# Patient Record
Sex: Female | Born: 1940 | Race: White | Hispanic: No | Smoking: Never smoker
Health system: Southern US, Community
[De-identification: ages and names within clinical notes are randomized; demographics above are authoritative.]

---

## 2009-01-06 ENCOUNTER — Ambulatory Visit: Payer: Self-pay | Admitting: Family Medicine

## 2009-01-06 DIAGNOSIS — N760 Acute vaginitis: Secondary | ICD-10-CM | POA: Insufficient documentation

## 2009-01-06 DIAGNOSIS — L290 Pruritus ani: Secondary | ICD-10-CM | POA: Insufficient documentation

## 2009-01-06 DIAGNOSIS — G4700 Insomnia, unspecified: Secondary | ICD-10-CM | POA: Insufficient documentation

## 2009-01-06 DIAGNOSIS — E785 Hyperlipidemia, unspecified: Secondary | ICD-10-CM | POA: Insufficient documentation

## 2009-01-06 DIAGNOSIS — K644 Residual hemorrhoidal skin tags: Secondary | ICD-10-CM | POA: Insufficient documentation

## 2009-01-06 DIAGNOSIS — F329 Major depressive disorder, single episode, unspecified: Secondary | ICD-10-CM | POA: Insufficient documentation

## 2009-01-06 DIAGNOSIS — M810 Age-related osteoporosis without current pathological fracture: Secondary | ICD-10-CM | POA: Insufficient documentation

## 2009-02-01 ENCOUNTER — Telehealth: Payer: Self-pay | Admitting: Family Medicine

## 2009-04-13 ENCOUNTER — Telehealth: Payer: Self-pay | Admitting: Family Medicine

## 2009-08-03 ENCOUNTER — Encounter: Admission: RE | Admit: 2009-08-03 | Discharge: 2009-08-03 | Payer: Self-pay | Admitting: Family Medicine

## 2009-11-11 ENCOUNTER — Encounter: Payer: Self-pay | Admitting: Family Medicine

## 2009-11-17 ENCOUNTER — Encounter: Payer: Self-pay | Admitting: Family Medicine

## 2010-07-21 ENCOUNTER — Telehealth: Payer: Self-pay | Admitting: Family Medicine

## 2010-07-21 DIAGNOSIS — I8390 Asymptomatic varicose veins of unspecified lower extremity: Secondary | ICD-10-CM | POA: Insufficient documentation

## 2010-07-22 ENCOUNTER — Encounter (INDEPENDENT_AMBULATORY_CARE_PROVIDER_SITE_OTHER): Payer: Self-pay | Admitting: *Deleted

## 2010-11-21 ENCOUNTER — Ambulatory Visit: Payer: Self-pay | Admitting: Family Medicine

## 2010-11-21 ENCOUNTER — Encounter: Admission: RE | Admit: 2010-11-21 | Discharge: 2010-11-21 | Payer: Self-pay | Admitting: Family Medicine

## 2010-11-21 DIAGNOSIS — M545 Low back pain, unspecified: Secondary | ICD-10-CM | POA: Insufficient documentation

## 2010-11-21 DIAGNOSIS — H6980 Other specified disorders of Eustachian tube, unspecified ear: Secondary | ICD-10-CM | POA: Insufficient documentation

## 2010-11-22 ENCOUNTER — Telehealth: Payer: Self-pay | Admitting: Family Medicine

## 2010-11-22 DIAGNOSIS — H9319 Tinnitus, unspecified ear: Secondary | ICD-10-CM | POA: Insufficient documentation

## 2010-12-01 ENCOUNTER — Encounter: Payer: Self-pay | Admitting: Family Medicine

## 2011-01-13 ENCOUNTER — Encounter: Payer: Self-pay | Admitting: Family Medicine

## 2011-01-24 NOTE — Assessment & Plan Note (Signed)
Summary: L ear pressure   Vital Signs:  Patient profile:   70 year old female Height:      62.5 inches Weight:      154 pounds BMI:     27.82 O2 Sat:      97 % on Room air Temp:     98.0 degrees F oral Pulse rate:   68 / minute BP sitting:   119 / 67  (left arm) Cuff size:   regular  Vitals Entered By: Payton Spark CMA (November 21, 2010 10:55 AM)  O2 Flow:  Room air CC: L ear pressure x 1 week.   Primary Care [facility]:  Seymour Bars DO  CC:  L ear pressure x 1 week.Marland Kitchen  History of Present Illness: 70 yo WF presents for L ear pressure x 1 wk.  No previous problems like this.  Has loss of hearing on the L with some loss on the R.  No tinnitus or dizziness.  No head congestion or allergy problems.  Taking Relafen for back pain, worse after raking the leaves.  Denies radiation, weakness or tinlging in her legs.  Has not had imaging done in years.  Denies change to bowel or bladder.    Current Medications (verified): 1)  Lipitor 10 Mg Tabs (Atorvastatin Calcium) .... Take One By Mouth Daily 2)  Zoloft 50 Mg Tabs (Sertraline Hcl) .... Take One By Mouth Daily 3)  Ambien 5 Mg Tabs (Zolpidem Tartrate) .... Take One By Mouth At [facility] As Needed Sleep 4)  Anamantle Hc 3-0.5 % Crea (Lidocaine-Hydrocortisone Ace) .... Apply To Hemorrhoids Bid  Allergies (verified): No Known Drug Allergies  Past History:  Past Medical History: Reviewed history from 01/06/2009 and no changes required. palpitations cardiac stress test normal 2006 high cholesterol HRT age [age] osteoporosis  Social History: Reviewed history from 01/06/2009 and no changes required. Retired [employer] Services operational officer. Widowed after 44 yrs in 2006. 1 son in [place]. sister in law here.  Lives alone.  No pets. Has a house here and 1 in [place], [place]. Never smoked.  Denies ETOH. 4 caffeine/ day.  Walks 3 days/ wk.  Review of Systems      See HPI  Physical Exam  General:  alert, well-developed, well-nourished, and well-hydrated.     Head:  normocephalic and atraumatic.   Eyes:  PERRLA; conjunctiva clear Ears:  EACs patent; TMs translucent and gray with good cone of light and bony landmarks. with small clear effusion on the L Nose:  no nasal discharge.   Mouth:  pharynx pink and moist.   Neck:  supple, full ROM, and no masses.   Lungs:  Normal respiratory effort, chest expands symmetrically. Lungs are clear to auscultation, no crackles or wheezes. Heart:  Normal rate and regular rhythm. S1 and S2 normal without gallop, murmur, click, rub or other extra sounds. Neurologic:  gait normal and DTRs symmetrical and normal.   Skin:  color normal.     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    Problem # 2:  BACK PAIN, LUMBAR, CHRONIC (ICD-724.2) Will xray today, start PT if indicated and alternate her relafen with tylenol arthritis.   Her updated medication list for this problem includes:    Nabumetone 750 Mg Tabs (Nabumetone) .Marland Kitchen... 1 tab by mouth two times a day with food as needed for back pain  Orders: T-DG Lumbar  Spine 2-3 Views (72100)  Complete Medication List: 1)  Lipitor 10 Mg Tabs (Atorvastatin calcium) .... Take one by mouth daily 2)  Zoloft 50 Mg Tabs (Sertraline hcl) .... Take one by mouth daily 3)  Ambien 5 Mg Tabs (Zolpidem tartrate) .... Take one by mouth at hs as needed sleep 4)  Anamantle Hc 3-0.5 % Crea (Lidocaine-hydrocortisone ace) .... Apply to hemorrhoids bid 5)  Nabumetone 750 Mg Tabs (Nabumetone) .Marland Kitchen.. 1 tab by mouth two times a day with food as needed for back pain  Patient Instructions: 1)  Alternate Tylenol Arthritis with RElafen for back pain. 2)  Xray back today.  Will call you w/ results tonight. 3)  Plan to start PT down the hall. 4)  For L ear effusion (clear fluid), start OTC plain Allegra once daily. 5)  If hearing problem does not  improve after 10 days, call me and I can get you in with ENT. Prescriptions: NABUMETONE 750 MG TABS (NABUMETONE) 1 tab by mouth two times a day with food as needed for back pain  #60 x 2   Entered and Authorized by:   Seymour Bars DO   Signed by:   Seymour Bars DO on 11/21/2010   Method used:   Electronically to        [facility] [phone]* (retail)       [address]       [place], [place]  [address]       Ph: [phone]       Fax: [phone]   RxID:   [id number]    Orders Added: 1)  T-DG Lumbar Spine 2-3 Views [72100] 2)  Est. Patient Level III [03474]

## 2011-01-24 NOTE — Progress Notes (Signed)
Summary: Buzzing in ear  Phone Note Call from Patient Call back at Home Phone [phone]   Caller: Patient Call For: Seymour Bars DO Summary of Call: left ear pain is killing her. Allegra not helping her at all- buzzing sound still present. ? get a referral to ENT Initial call taken by: Kathlene November LPN,  November 22, 2010 4:01 PM  Follow-up for Phone Call        Will send in RX for Tylenol #3 to use for pain.  REferral made to ENT. Follow-up by: Seymour Bars DO,  November 22, 2010 4:43 PM  New Problems: TINNITUS (ICD-388.30)   New Problems: TINNITUS (ICD-388.30) New/Updated Medications: TYLENOL WITH CODEINE #3 300-30 MG TABS (ACETAMINOPHEN-CODEINE) 1-2 tabs by mouth three times a day as needed pain; take with food Prescriptions: TYLENOL WITH CODEINE #3 300-30 MG TABS (ACETAMINOPHEN-CODEINE) 1-2 tabs by mouth three times a day as needed pain; take with food  #24 x 0   Entered and Authorized by:   Seymour Bars DO   Signed by:   Seymour Bars DO on 11/22/2010   Method used:   Printed then faxed to ...       [facility] [phone]* (retail)       [address] [place], [place]  [address]       Ph: [phone]       Fax: [phone]   RxID:   [id number]   Appended Document: Buzzing in ear Pt aware of above.

## 2011-01-24 NOTE — Progress Notes (Signed)
Summary: Referral  Phone Note Call from Patient   Caller: Patient Summary of Call: Call Back   [phone]  Patient need a Referral to [facility]... Initial call taken by: Vanessa Swaziland,  July 21, 2010 10:55 AM  Follow-up for Phone Call        is this for varicose veins or spider veins? Follow-up by: Seymour Bars DO,  July 21, 2010 11:11 AM  Additional Follow-up for Phone Call Additional follow up Details #1::        [facility] for Pt to CB to clarify.  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 She has had surgery on both legs in the past but now veins are back and worse than before. Pt would like to be referred to [facility] Additional Follow-up by: Payton Spark CMA,  July 21, 2010 12:54 PM  New Problems: VARICOSE VEINS, LOWER EXTREMITIES (ICD-454.9)   Additional Follow-up for Phone Call Additional follow up Details #2::    referral put in.  Pls print from [place] office. Follow-up by: Seymour Bars DO,  July 21, 2010 1:46 PM  New Problems: VARICOSE VEINS, LOWER EXTREMITIES (ICD-454.9)

## 2011-01-24 NOTE — Letter (Signed)
Summary: Primary Care Consult Scheduled Letter  [facility]  [address]   [place], [place] [address]   Phone: [phone]  Fax: [phone]      07/22/2010 MRN: [id number]  Molly Pruitt [address] [place], [place]  [address]    Dear Ms. Laurine Blazer,     We have scheduled an appointment for you.  At the recommendation of Dr.Bowen, we have scheduled you a consult with [facility] on 11/24/10 at 11:30.  Their address is [address], [place] [employer]. [phone]. The office phone number is [phone].  If this appointment day and time is not convenient for you, please feel free to call the office of the doctor you are being referred to at the number listed above and reschedule the appointment.     It is important for you to keep your scheduled appointments. We are here to make sure you are given good patient care.    Thank you, Michaelle Copas, [phone] Patient Care Coordinator [facility]

## 2011-01-26 NOTE — Consult Note (Signed)
Summary: [facility]  [facility]   Imported By: Lanelle Bal 12/09/2010 11:03:18  _____________________________________________________________________  External Attachment:    Type:   Image     Comment:   External Document

## 2011-02-15 NOTE — Therapy (Signed)
Summary: [facility]   Imported By: Kassie Mends 02/09/2011 08:15:44  _____________________________________________________________________  External Attachment:    Type:   Image     Comment:   External Document

## 2011-11-30 ENCOUNTER — Ambulatory Visit (INDEPENDENT_AMBULATORY_CARE_PROVIDER_SITE_OTHER): Payer: Medicare Other | Admitting: Family Medicine

## 2011-11-30 ENCOUNTER — Encounter: Payer: Self-pay | Admitting: Family Medicine

## 2011-11-30 VITALS — BP 119/64 | HR 67 | Temp 98.1°F | Ht 62.5 in | Wt 142.0 lb

## 2011-11-30 DIAGNOSIS — I252 Old myocardial infarction: Secondary | ICD-10-CM

## 2011-11-30 DIAGNOSIS — H6502 Acute serous otitis media, left ear: Secondary | ICD-10-CM

## 2011-11-30 DIAGNOSIS — H6500 Acute serous otitis media, unspecified ear: Secondary | ICD-10-CM

## 2011-11-30 DIAGNOSIS — J40 Bronchitis, not specified as acute or chronic: Secondary | ICD-10-CM

## 2011-11-30 MED ORDER — BENZONATATE 200 MG PO CAPS: 200.0000 mg | ORAL_CAPSULE | Freq: Three times a day (TID) | ORAL | Status: AC | PRN

## 2011-11-30 MED ORDER — AZITHROMYCIN 250 MG PO TABS: ORAL_TABLET | ORAL | Status: AC

## 2011-11-30 NOTE — Progress Notes (Signed)
  Subjective:    Patient ID: Molly Pruitt, female    DOB: 1941/01/17, 70 y.o.   MRN: [id number]  Cough This is a new problem. The current episode started 1 to 4 weeks ago. The problem has been gradually worsening. The problem occurs every few minutes. The cough is productive of purulent sputum. Associated symptoms include a sore throat. Pertinent negatives include no chest pain, nasal congestion, postnasal drip, rash, rhinorrhea, shortness of breath, sweats or wheezing. Associated symptoms comments: Lear pain. The symptoms are aggravated by lying down. She has tried nothing for the symptoms. There is no history of asthma, bronchitis, COPD, emphysema or pneumonia.   Patient informs me that she had a MI in August and is currently under a Cardiologist care   Review of Systems  HENT: Positive for sore throat. Negative for rhinorrhea and postnasal drip.   Respiratory: Positive for cough. Negative for shortness of breath and wheezing.   Cardiovascular: Negative for chest pain.       MI this summer in August she states that she has 2 blockages that they are treating medically.  Skin: Negative for rash.  All other systems reviewed and are negative.      BP 119/64  Pulse 67  Temp(Src) 98.1 F (36.7 C) (Oral)  Ht 5' 2.5" (1.588 m)  Wt 142 lb (64.411 kg)  BMI 25.56 kg/m2  SpO2 100% Objective:   Physical Exam  Nursing note and vitals reviewed. Constitutional: She is oriented to person, place, and time. She appears well-developed and well-nourished. No distress.  HENT:  Head: Normocephalic and atraumatic.  Right Ear: Tympanic membrane and external ear normal.  Left Ear: Ear canal normal.  No middle ear effusion.  Nose: Nose normal.  Mouth/Throat: Oropharynx is clear and moist. No oropharyngeal exudate.  Eyes: Right eye exhibits no discharge. Left eye exhibits no discharge. No scleral icterus.  Neck: Neck supple.  Cardiovascular: Normal rate, regular rhythm and normal heart sounds.     Pulmonary/Chest: Effort normal. No respiratory distress. She has no wheezes. She has rhonchi. She has no rales.  Lymphadenopathy:    She has cervical adenopathy.  Neurological: She is alert and oriented to person, place, and time.  Skin: Skin is warm and dry.  Psychiatric: She has a normal mood and affect. Her behavior is normal.          Assessment & Plan:  Bronchitis we'll place on Z-Pak Tessalon Perles she has nasal congestion but going to hold off on placing her on any type of antihistamine decongestant because of her recent MI return in a week if not better return to office  For continuation of care once her cardiologist semi releases her

## 2011-11-30 NOTE — Patient Instructions (Signed)
Bronchiectasis Bronchiectasis is the destruction and widening of the large airways caused by mucous blockage. It is one of the chronic obstructive pulmonary diseases (COPD). It is often complicated by bronchitis and emphysema. You can be born with it (congenital bronchiectasis). It also may develop later in life. When the lungs cannot get rid of mucus, it gathers in the airways. The blockage leads to infection. This causes a soreness (inflammation) in the air passageways. The inflammation causes the air passageways to weaken and widen. The weakened passages can then become scarred and deformed. This begins a cycle which may continue to worsen. CAUSES   Bronchiectasis often begins in childhood and in the [place] about half of these cases come from cystic fibrosis.   Recurrent lung infections are another common cause.   Abnormal lung defenses a person is born.   Foreign bodies or other blockage in the lungs are other causes.  Breathing in food particles regularly while eating is an example.  It is also caused from a birth defect in the lining of the lungs.  SYMPTOMS  Symptoms vary from patient to patient but can include:  Coughing which is worse lying down.   Shortness of breath and wheezing.   Weakness, weight loss, and fatigue.   With infections the mucus may be discolored, contain blood and smell badly.  DIAGNOSIS  Tests for diagnosing bronchiectasis may include:  Chest X-rays or CT scans.   Breathing tests which tell your caregiver how your lungs are working.   Sputum cultures to check for infection.   Blood testing and tests for other related diseases or causes such as cystic fibrosis or tuberculosis may be done.  HOME CARE INSTRUCTIONS   Cough suppressants may be used if you cannot rest; however cough is a protective mechanism for clearing our lungs and is one reason for avoiding cough suppressants if able as they take away this protection.   Your caregiver may prescribe an  expectorant to loosen the mucus to be coughed up.   Only take over-the-counter or prescription medicines for pain, discomfort, or fever as directed by your caregiver.   A cold steam vaporizer or humidifier in your room or home may help loosen secretions.   Cough is most often worse at night. Sleeping in a semi-upright position in a recliner or using a couple pillows will help.   Obtain rest as needed.   Make arrangements for a follow-up visit.   Avoid cigarette smoke and lung irritants. Smoking is a common cause of bronchitis and can contribute to pneumonia. Stopping this habit is an important self help.   Stay inside when pollution and ozone levels are high.   Stay current with vaccinations and immunizations.   Avoid sedatives and antihistamines which tend to thicken the mucus in the lungs.   Always try to stay well hydrated by drinking plenty of water.   Antibiotics are often given for infection. Take them as directed.   Medications called bronchodilators are often used to make the air passages larger.   Physical therapy methods should be learned so you can know how to clear mucus more easily from your lungs.  PROGNOSIS   Bronchiectasis varies in severity from patient to patient.   When lung infections are treated immediately, bronchiectasis is less likely to develop.   When it does develop, following the above instructions will help with the outcome.   Lung transplants or removing a portion of a lung are sometimes done for severe cases. Death is uncommon but  can be caused by massive bleeding into the lungs.  SEEK IMMEDIATE MEDICAL CARE IF:   You develop more pus like (purulent) sputum, have uncontrolled temperature, or become progressively more ill (debilitated). This is especially true if you are elderly or sick from another disease.   You cannot control your cough with suppressants and are losing sleep.   You begin coughing up blood.   You develop chest pain or  increasing shortness of breath.   You develop pain which is getting worse or is uncontrolled with medications.   You develop an oral temperature above 102 F (38.9 C) or as instructed, after not having a temperature for one or more days (afebrile).   If any of the symptoms which brought you initially into the emergency room are getting worse rather than better.  MAKE SURE YOU:   Understand these instructions.   Will watch your condition.   Will get help right away if you are not doing well or get worse.  Document Released: 10/08/2007 Document Revised: 08/23/2011 Document Reviewed: 10/08/2007 [facility] Patient Information 2012 [place], [place].

## 2011-12-03 ENCOUNTER — Encounter: Payer: Self-pay | Admitting: Family Medicine

## 2011-12-03 DIAGNOSIS — I252 Old myocardial infarction: Secondary | ICD-10-CM | POA: Insufficient documentation

## 2012-01-30 ENCOUNTER — Ambulatory Visit: Payer: BC Managed Care – PPO

## 2012-02-06 ENCOUNTER — Ambulatory Visit (INDEPENDENT_AMBULATORY_CARE_PROVIDER_SITE_OTHER): Payer: Medicare Other | Admitting: Family Medicine

## 2012-02-06 VITALS — BP 125/64 | HR 60

## 2012-02-06 DIAGNOSIS — R300 Dysuria: Secondary | ICD-10-CM

## 2012-02-06 LAB — POCT URINALYSIS DIPSTICK
Bilirubin, UA: NEGATIVE
Glucose, UA: NEGATIVE
Ketones, UA: NEGATIVE
Leukocytes, UA: NEGATIVE
Nitrite, UA: NEGATIVE
pH, UA: 5.5

## 2012-02-06 NOTE — Progress Notes (Signed)
In fact I put the order in.

## 2012-02-06 NOTE — Progress Notes (Signed)
Please go ahead and get that urine culture please.

## 2012-02-06 NOTE — Progress Notes (Signed)
  Subjective:    Patient ID: Molly Pruitt, female    DOB: 1941/01/15, 71 y.o.   MRN: [id number] UTI; frequency, burning and discomfort x 2 weeks: do you want specimen sent for culture. HPI    Review of Systems     Objective:   Physical Exam        Assessment & Plan:  Patient not seen at this visit by the physician.

## 2012-02-13 LAB — URINE CULTURE: Colony Count: 25000

## 2012-02-15 MED ORDER — NITROFURANTOIN MONOHYD MACRO 100 MG PO CAPS: 100.0000 mg | ORAL_CAPSULE | Freq: Two times a day (BID) | ORAL | Status: AC

## 2012-02-15 NOTE — Progress Notes (Signed)
Addended by: Hassan Rowan on: 02/15/2012 05:03 PM   Modules accepted: Orders

## 2013-01-22 ENCOUNTER — Ambulatory Visit: Payer: BC Managed Care – PPO | Admitting: Physician Assistant

## 2014-03-04 ENCOUNTER — Other Ambulatory Visit: Payer: Self-pay | Admitting: *Deleted

## 2014-03-04 DIAGNOSIS — M5137 Other intervertebral disc degeneration, lumbosacral region: Secondary | ICD-10-CM

## 2014-03-04 DIAGNOSIS — Z139 Encounter for screening, unspecified: Secondary | ICD-10-CM

## 2014-03-04 DIAGNOSIS — Z13820 Encounter for screening for osteoporosis: Secondary | ICD-10-CM

## 2014-03-13 ENCOUNTER — Other Ambulatory Visit: Payer: Self-pay | Admitting: Family Medicine

## 2014-03-13 DIAGNOSIS — M5137 Other intervertebral disc degeneration, lumbosacral region: Secondary | ICD-10-CM

## 2014-03-17 ENCOUNTER — Ambulatory Visit (INDEPENDENT_AMBULATORY_CARE_PROVIDER_SITE_OTHER): Payer: Medicare Other

## 2014-03-17 DIAGNOSIS — M129 Arthropathy, unspecified: Secondary | ICD-10-CM

## 2014-03-17 DIAGNOSIS — M5137 Other intervertebral disc degeneration, lumbosacral region: Secondary | ICD-10-CM

## 2015-01-06 ENCOUNTER — Encounter: Payer: Self-pay | Admitting: Physician Assistant

## 2015-01-06 ENCOUNTER — Ambulatory Visit (INDEPENDENT_AMBULATORY_CARE_PROVIDER_SITE_OTHER): Payer: Medicare Other | Admitting: Physician Assistant

## 2015-01-06 VITALS — BP 117/62 | HR 63 | Wt 146.0 lb

## 2015-01-06 DIAGNOSIS — R21 Rash and other nonspecific skin eruption: Secondary | ICD-10-CM | POA: Diagnosis not present

## 2015-01-06 MED ORDER — CLOTRIMAZOLE-BETAMETHASONE 1-0.05 % EX CREA: 1.0000 "application " | TOPICAL_CREAM | Freq: Two times a day (BID) | CUTANEOUS | Status: DC

## 2015-01-06 NOTE — Patient Instructions (Signed)
Biopsy Care After Refer to this sheet in the next few weeks. These instructions provide you with information on caring for yourself after your procedure. Your caregiver may also give you more specific instructions. Your treatment has been planned according to current medical practices, but problems sometimes occur. Call your caregiver if you have any problems or questions after your procedure. If you had a fine needle biopsy, you may have soreness at the biopsy site for 1 to 2 days. If you had an open biopsy, you may have soreness at the biopsy site for 3 to 4 days. HOME CARE INSTRUCTIONS   You may resume normal diet and activities as directed.  Change bandages (dressings) as directed. If your wound was closed with a skin glue (adhesive), it will wear off and begin to peel in 7 days.  Only take over-the-counter or prescription medicines for pain, discomfort, or fever as directed by your caregiver.  Ask your caregiver when you can bathe and get your wound wet. SEEK IMMEDIATE MEDICAL CARE IF:   You have increased bleeding (more than a small spot) from the biopsy site.  You notice redness, swelling, or increasing pain at the biopsy site.  You have pus coming from the biopsy site.  You have a fever.  You notice a bad smell coming from the biopsy site or dressing.  You have a rash, have difficulty breathing, or have any allergic problems. MAKE SURE YOU:   Understand these instructions.  Will watch your condition.  Will get help right away if you are not doing well or get worse. Document Released: 06/30/2005 Document Revised: 03/04/2012 Document Reviewed: 06/08/2011 Exit[employer] Patient Information 2015 [employer]. This information is not intended to replace advice given to you by your health care provider. Make sure you discuss any questions you have with your health care provider.  

## 2015-01-06 NOTE — Progress Notes (Signed)
Subjective:    Patient ID: Molly Pruitt, female    DOB: 1941/10/02, 74 y.o.   MRN: [id number]  HPI  Patient is a 74 year old female who presents to the clinic to establish care. She admits she is not living here full-time. She travels back and forth to [place] where she has another primary care physician. She has a problem she would like to be addressed here while she is in town.  .. Active Ambulatory Problems    Diagnosis Date Noted  . HYPERLIPIDEMIA 01/06/2009  . DEPRESSION 01/06/2009  . 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 HEMORRHOIDS, EXTERNAL 01/06/2009  . UNSPECIFIED VAGINITIS AND VULVOVAGINITIS 01/06/2009  . PRURITUS ANI 01/06/2009  . BACK PAIN, LUMBAR, CHRONIC 11/21/2010  . OSTEOPOROSIS 01/06/2009  . INSOMNIA 01/06/2009  . Myocardial infarct, old 12/03/2011   Resolved Ambulatory Problems    Diagnosis Date Noted  . No Resolved Ambulatory Problems   No Additional Past Medical History   .Marland KitchenNo family history on file.   .. History   Social History  . Marital Status: Widowed    Spouse Name: N/A    Number of Children: N/A  . Years of Education: N/A   Occupational History  . Not on file.   Social History Main Topics  . Smoking status: Never Smoker   . Smokeless tobacco: Never Used  . Alcohol Use: Not on file  . Drug Use: Not on file  . Sexual Activity: Not on file   Other Topics Concern  . Not on file   Social History Narrative   Patient comes in today to discuss ongoing off and on rash that occurs on her lower extremities only. She noticed the rash about one year ago. This appeared to be after she was raking some leaves. The lesions come and go and are very itchy. She denies any pain. She denies any fever, chills, sore throat or any other respiratory symptoms. As they are drying up they leave a crusty center and that eventually will clear as well. She has used over-the-counter  anti-itch cream which seems to help some. She's not sure if this correlates but yesterday she took a Relafen and the bumps seemed to intensify. She's never been evaluated for this problem.   Review of Systems  All other systems reviewed and are negative.      Objective:   Physical Exam  Constitutional: She is oriented to person, place, and time. She appears well-developed and well-nourished.  HENT:  Head: Normocephalic and atraumatic.  Cardiovascular: Normal rate, regular rhythm and normal heart sounds.   Pulmonary/Chest: Effort normal and breath sounds normal.  Neurological: She is alert and oriented to person, place, and time.  Skin:     Psychiatric: She has a normal mood and affect. Her behavior is normal.          Assessment & Plan:  Important to note we do not have an up-to-date medication list. We submitted a request to her PCP for this. We just are not received it. Once we get this we will added into the system to be up-to-date.  Rash-unclear etiology at this point. Punch biopsy was performed today. Will call patient with results. In the meantime given Lotrisone cream to use to see if it helps with the active patches today. It does not appear to be fungal but this would also help with any fungal elements.  Punch Biopsy Procedure Note  Pre-operative Diagnosis: unknown  rash  Post-operative Diagnosis: same  Locations:left upper thigh  Indications: unknown itchy rash  Anesthesia: Lidocaine 1% without epinephrine without added sodium bicarbonate  Procedure Details  History of allergy to iodine: no Patient informed of the risks (including bleeding and infection) and benefits of the  procedure and Verbal informed consent obtained.  The lesion and surrounding area was given a sterile prep using betadyne and draped in the usual sterile fashion. The skin was then stretched perpendicular to the skin tension lines and the lesion removed using the 6 mm punch. The resulting  ellipse was then closed. The wound was closed with 4-0 prolene using simple interrupted stitches. Antibiotic ointment and a sterile dressing applied. The specimen was sent for pathologic examination. The patient tolerated the procedure well.  EBL: scant   Condition: Stable  Complications: none.  Plan: 1. Instructed to keep the wound dry and covered for 24-48h and clean thereafter. 2. Warning signs of infection were reviewed.   3. Recommended that the patient use OTC acetaminophen as needed for pain.  4. Return for suture removal in 7 days.

## 2015-01-13 ENCOUNTER — Other Ambulatory Visit: Payer: Self-pay | Admitting: Physician Assistant

## 2015-01-13 DIAGNOSIS — R21 Rash and other nonspecific skin eruption: Secondary | ICD-10-CM

## 2015-01-18 ENCOUNTER — Telehealth: Payer: Self-pay | Admitting: Physician Assistant

## 2015-01-18 ENCOUNTER — Other Ambulatory Visit: Payer: Self-pay | Admitting: *Deleted

## 2015-01-18 MED ORDER — NABUMETONE 500 MG PO TABS: 500.0000 mg | ORAL_TABLET | Freq: Every day | ORAL | Status: DC

## 2015-01-18 NOTE — Telephone Encounter (Signed)
Call pt: finally got med list. Seems relafen could be the most likely drug to be reacting to if infact a drug reaction due to as needed status. All other medications are regular meds.  Hopefully you can get autoimmune work up and we can give you some more information. I would stay away from relafen at this time.

## 2015-01-21 ENCOUNTER — Encounter: Payer: Self-pay | Admitting: Physician Assistant

## 2015-01-21 DIAGNOSIS — R21 Rash and other nonspecific skin eruption: Secondary | ICD-10-CM | POA: Insufficient documentation

## 2015-01-21 LAB — COMPLETE METABOLIC PANEL WITH GFR
ALT: 10 U/L (ref 0–35)
AST: 18 U/L (ref 0–37)
Albumin: 3.9 g/dL (ref 3.5–5.2)
Alkaline Phosphatase: 57 U/L (ref 39–117)
BILIRUBIN TOTAL: 1.2 mg/dL (ref 0.2–1.2)
BUN: 12 mg/dL (ref 6–23)
CHLORIDE: 108 meq/L (ref 96–112)
CO2: 26 meq/L (ref 19–32)
Calcium: 9.1 mg/dL (ref 8.4–10.5)
Creat: 0.59 mg/dL (ref 0.50–1.10)
Glucose, Bld: 81 mg/dL (ref 70–99)
Potassium: 5 mEq/L (ref 3.5–5.3)
Sodium: 143 mEq/L (ref 135–145)
TOTAL PROTEIN: 5.9 g/dL — AB (ref 6.0–8.3)

## 2015-01-21 LAB — CBC WITH DIFFERENTIAL/PLATELET
BASOS PCT: 1 % (ref 0–1)
Basophils Absolute: 0.1 10*3/uL (ref 0.0–0.1)
EOS PCT: 6 % — AB (ref 0–5)
Eosinophils Absolute: 0.3 10*3/uL (ref 0.0–0.7)
HEMATOCRIT: 38.7 % (ref 36.0–46.0)
Hemoglobin: 12.8 g/dL (ref 12.0–15.0)
LYMPHS PCT: 23 % (ref 12–46)
Lymphs Abs: 1.2 10*3/uL (ref 0.7–4.0)
MCH: 30 pg (ref 26.0–34.0)
MCHC: 33.1 g/dL (ref 30.0–36.0)
MCV: 90.8 fL (ref 78.0–100.0)
MPV: 9.8 fL (ref 8.6–12.4)
Monocytes Absolute: 0.4 10*3/uL (ref 0.1–1.0)
Monocytes Relative: 7 % (ref 3–12)
Neutro Abs: 3.4 10*3/uL (ref 1.7–7.7)
Neutrophils Relative %: 63 % (ref 43–77)
Platelets: 254 10*3/uL (ref 150–400)
RBC: 4.26 MIL/uL (ref 3.87–5.11)
RDW: 13.7 % (ref 11.5–15.5)
WBC: 5.4 10*3/uL (ref 4.0–10.5)

## 2015-01-21 LAB — C-REACTIVE PROTEIN: CRP: <0.5 mg/dL (ref <0.60)

## 2015-01-21 LAB — SEDIMENTATION RATE: SED RATE: 5 mm/h (ref 0–22)

## 2015-01-21 LAB — CYCLIC CITRUL PEPTIDE ANTIBODY, IGG: Cyclic Citrullin Peptide Ab: <2 U/mL (ref 0.0–5.0)

## 2015-01-21 LAB — ANA: Anti Nuclear Antibody(ANA): NEGATIVE

## 2015-01-21 LAB — RHEUMATOID FACTOR

## 2015-02-23 NOTE — Telephone Encounter (Signed)
Per amber pt was called but was not documented.

## 2016-01-22 IMAGING — MR MR LUMBAR SPINE W/O CM
4 of 5 series · 27 of 48 positions shown · non-contrast
Comparison: None.

CLINICAL DATA: Low back, no leg pain

EXAM:
MRI LUMBAR SPINE WITHOUT CONTRAST
TECHNIQUE: Multiplanar, multisequence MR imaging was performed. No intravenous
contrast was administered.

[Series 3: T1 · sagittal · 4.0mm · 0.51mm/px · 6 of 14 slices shown (1 of 2)]
[im 1/14]
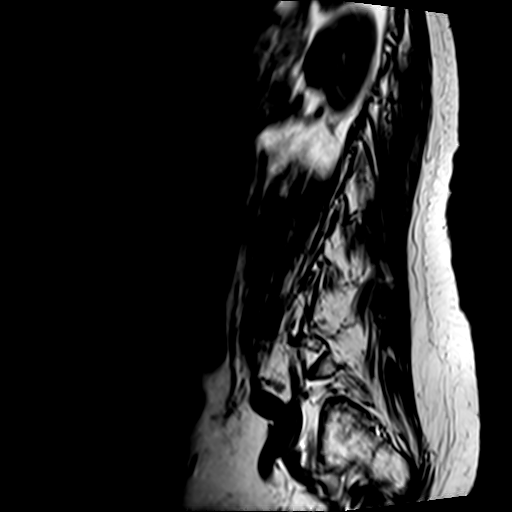
[im 3/14]
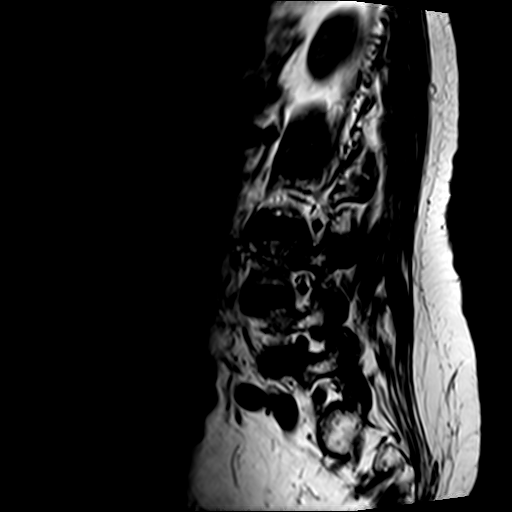
[im 6/14]
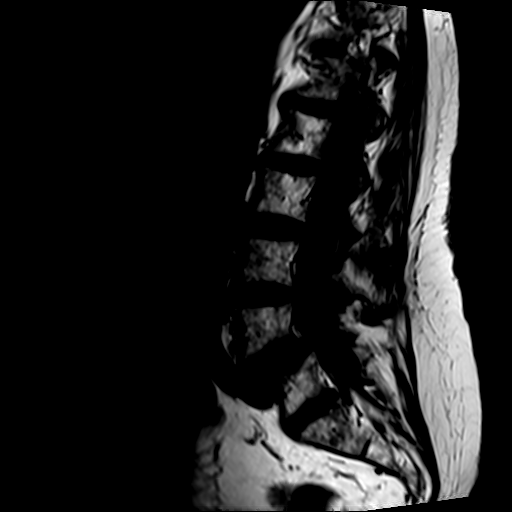
[im 8/14]
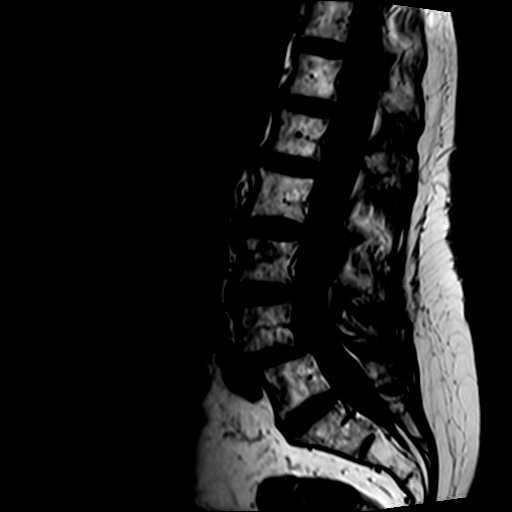
[im 11/14]
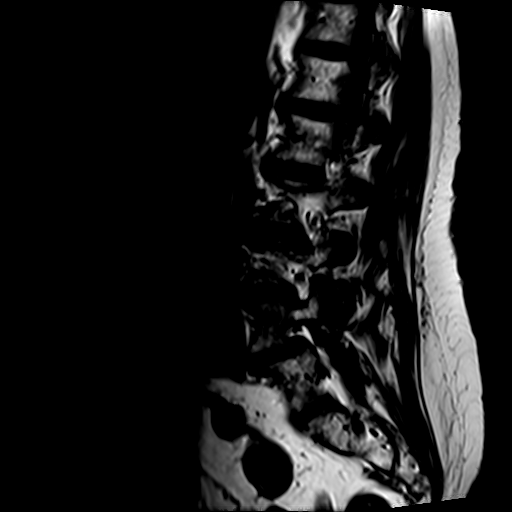
[im 14/14]
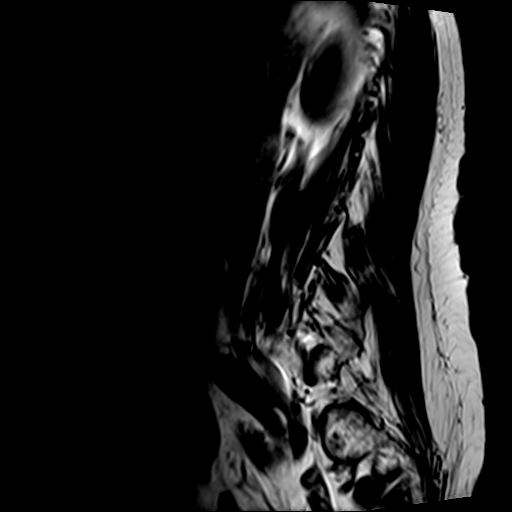

[Series 4: T2 · sagittal · 4.0mm · 0.81mm/px · 7 of 14 slices shown (1 of 2)]
[im 1/14]
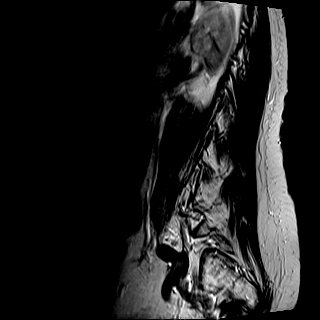
[im 3/14]
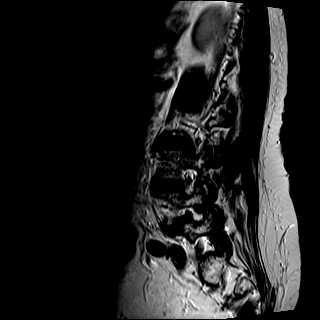
[im 5/14]
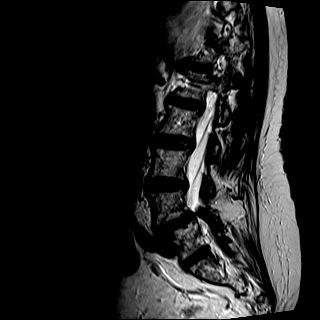
[im 7/14]
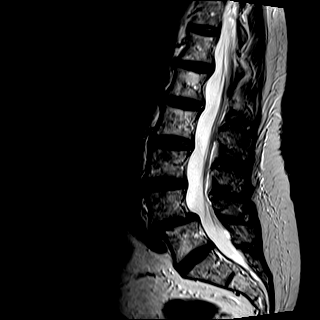
[im 9/14]
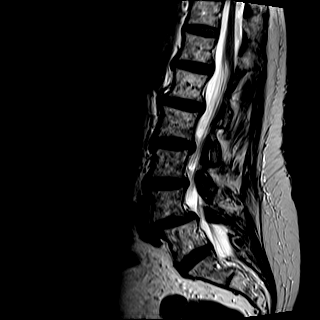
[im 11/14]
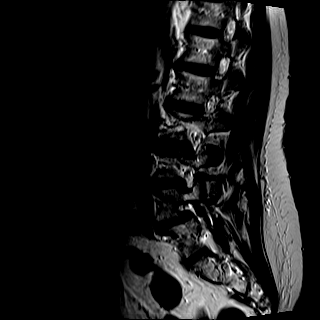
[im 14/14]
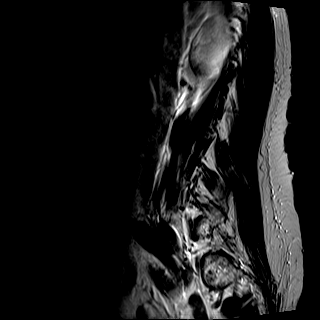

[Series 6: T2 · axial · 4.0mm · 0.39mm/px · z∈[+30,+202]mm · 8 of 30 slices shown (2 of 2)]
[im 1/30]
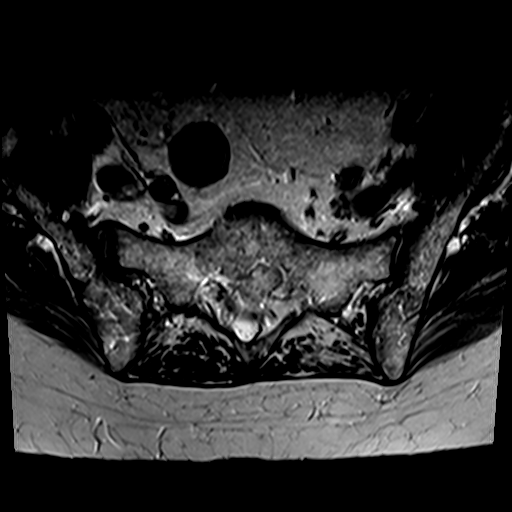
[im 5/30]
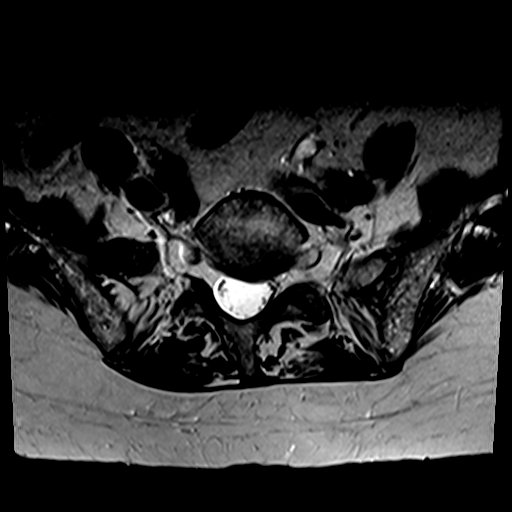
[im 9/30]
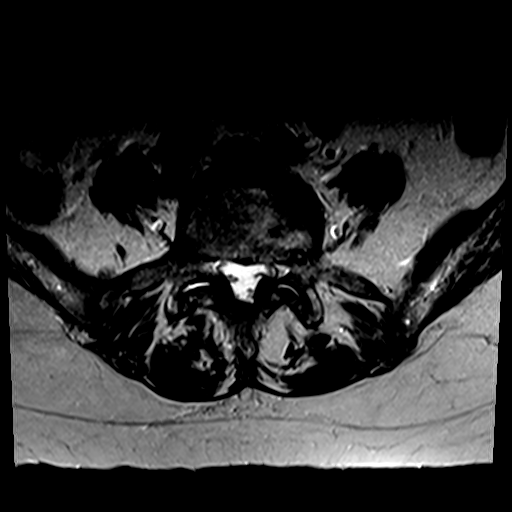
[im 14/30]
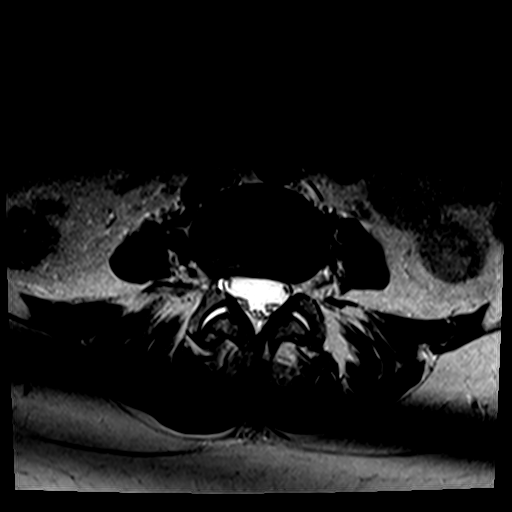
[im 16/30]
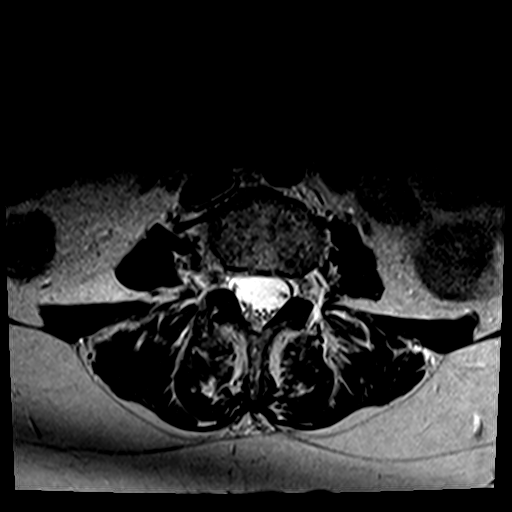
[im 21/30]
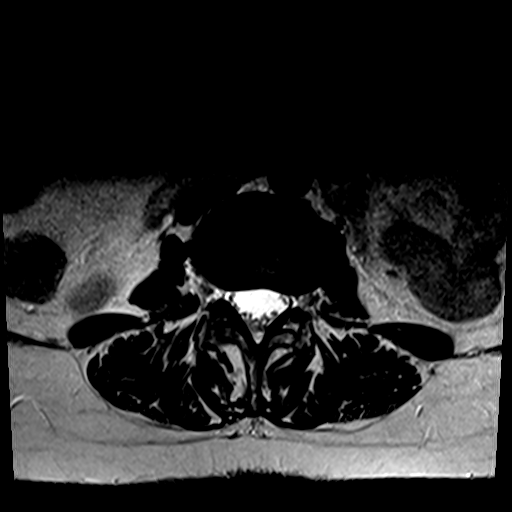
[im 25/30]
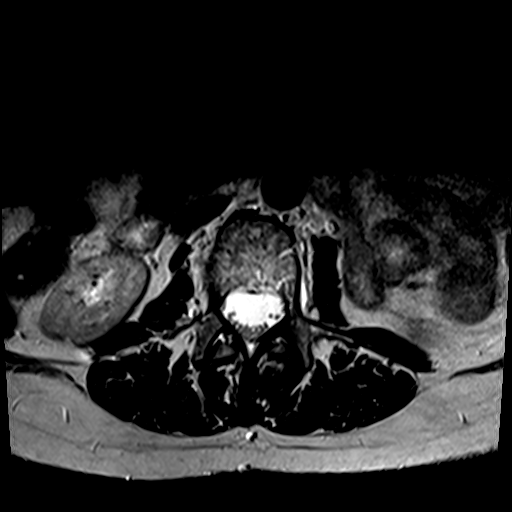
[im 30/30]
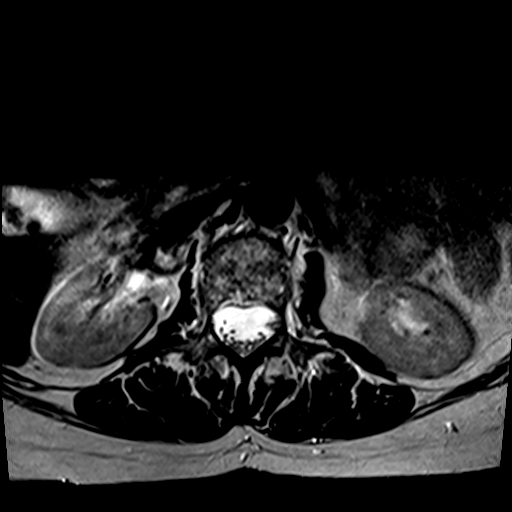

[Series 7: T1 · axial · 4.0mm · 0.78mm/px · z∈[+30,+177]mm · 6 of 30 slices shown (2 of 2)]
[im 1/30]
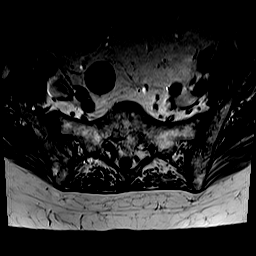
[im 5/30]
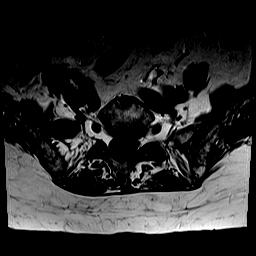
[im 9/30]
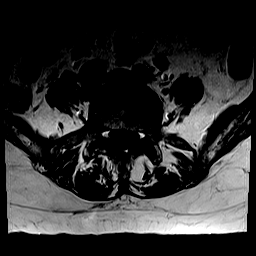
[im 14/30]
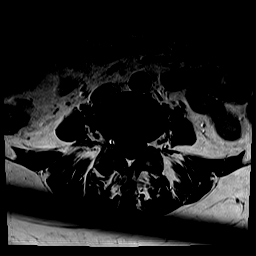
[im 16/30]
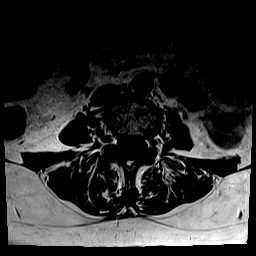
[im 25/30]
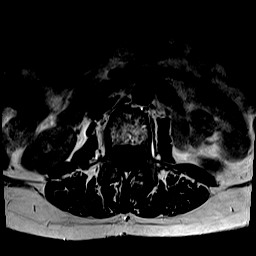

[27 of 48 positions shown; findings below may reference images not displayed]

FINDINGS: The vertebral bodies of the lumbar spine are normal in size. There
is grade 1 anterolisthesis of L4 on L5 secondary to bilateral facet
arthropathy. There is normal bone marrow signal demonstrated
throughout the vertebra. There is degenerative disc disease at L4-5.

The spinal cord is normal in signal and contour. The cord terminates
normally at T12-L1 . The nerve roots of the cauda equina and the
filum terminale are normal.

The visualized portions of the SI joints are unremarkable.

The imaged intra-abdominal contents are unremarkable.

T12-L1: No significant disc bulge. No evidence of neural foraminal
stenosis. No central canal stenosis.

L1-L2: No significant disc bulge. No evidence of neural foraminal
stenosis. No central canal stenosis.

L2-L3: Mild broad-based disc bulge. Mild bilateral facet
arthropathy. No evidence of neural foraminal stenosis. No central
canal stenosis.

L3-L4: Mild broad-based disc bulge. Mild bilateral facet
arthropathy. No evidence of neural foraminal stenosis. No central
canal stenosis.

L4-L5: Pseudo disc bulge secondary to grade 1 anterolisthesis.
Severe bilateral facet arthropathy with ligamentum flavum infolding
resulting in bilateral lateral recess stenosis. Mild bilateral
foraminal stenosis. Mild central canal stenosis.

L5-S1: No significant disc bulge. No evidence of neural foraminal
stenosis. No central canal stenosis.
IMPRESSION: 1. At L4-5 there is grade 1 anterolisthesis with severe bilateral
facet arthropathy and ligamentum flavum infolding resulting in
bilateral lateral recess stenosis and mild bilateral foraminal
stenosis. There is mild central canal stenosis.

## 2016-09-21 ENCOUNTER — Ambulatory Visit: Payer: Medicare Other | Admitting: Family Medicine

## 2016-09-27 ENCOUNTER — Encounter: Payer: Self-pay | Admitting: Physician Assistant

## 2016-09-27 DIAGNOSIS — I340 Nonrheumatic mitral (valve) insufficiency: Secondary | ICD-10-CM | POA: Insufficient documentation

## 2016-09-27 DIAGNOSIS — I351 Nonrheumatic aortic (valve) insufficiency: Secondary | ICD-10-CM | POA: Insufficient documentation

## 2016-09-27 DIAGNOSIS — I071 Rheumatic tricuspid insufficiency: Secondary | ICD-10-CM | POA: Insufficient documentation

## 2017-02-09 ENCOUNTER — Encounter: Payer: Self-pay | Admitting: Physician Assistant

## 2017-02-09 ENCOUNTER — Ambulatory Visit (INDEPENDENT_AMBULATORY_CARE_PROVIDER_SITE_OTHER): Payer: Medicare Other | Admitting: Physician Assistant

## 2017-02-09 VITALS — BP 103/62 | HR 73 | Temp 98.2°F | Ht 62.5 in | Wt 147.0 lb

## 2017-02-09 DIAGNOSIS — J208 Acute bronchitis due to other specified organisms: Secondary | ICD-10-CM | POA: Diagnosis not present

## 2017-02-09 MED ORDER — BENZONATATE 100 MG PO CAPS: ORAL_CAPSULE | ORAL | 0 refills | Status: DC

## 2017-02-09 MED ORDER — IPRATROPIUM BROMIDE 0.06 % NA SOLN: 2.0000 | Freq: Three times a day (TID) | NASAL | 1 refills | Status: DC

## 2017-02-09 MED ORDER — AZITHROMYCIN 250 MG PO TABS: ORAL_TABLET | ORAL | 0 refills | Status: DC

## 2017-02-09 NOTE — Progress Notes (Signed)
   Subjective:    Patient ID: Molly Pruitt, female    DOB: 1941/04/04, 76 y.o.   MRN: [id number]  HPI  Pt is a 76 yo female who presents to the clinic with 4 days of coughing that is not productive except for first thing in the morning. She had some night sweats that she feels like had a fever. No SOB or wheezing. She is taking mucinex with little relief. She denies any ear pain, sinus pressure, ST. She feels like the mucus is stuck in her lungs.    Review of Systems  All other systems reviewed and are negative.      Objective:   Physical Exam  Constitutional: She is oriented to person, place, and time. She appears well-developed and well-nourished.  HENT:  Head: Normocephalic and atraumatic.  Right Ear: External ear normal.  Left Ear: External ear normal.  Nose: Nose normal.  Mouth/Throat: Oropharynx is clear and moist. No oropharyngeal exudate.  TM's clear.  Negative sinus pressure to palpation.   Neck: Normal range of motion. Neck supple.  Cardiovascular: Normal rate, regular rhythm and normal heart sounds.   Pulmonary/Chest: Effort normal and breath sounds normal. She has no wheezes. She has no rales. She exhibits no tenderness.  Lymphadenopathy:    She has no cervical adenopathy.  Neurological: She is alert and oriented to person, place, and time.  Psychiatric: She has a normal mood and affect. Her behavior is normal.          Assessment & Plan:  Marland KitchenMarland KitchenLovena was seen today for cough, headache, nausea and diarrhea.  Diagnoses and all orders for this visit:  Acute bronchitis due to other specified organisms -     benzonatate (TESSALON) 100 MG capsule; Take 1-2 tablets as needed up to three times a day. -     ipratropium (ATROVENT) 0.06 % nasal spray; Place 2 sprays into both nostrils 3 (three) times daily. -     azithromycin (ZITHROMAX) 250 MG tablet; Take 2 tablets now and then one tablet for 4 days.   zpak was printed out for patient to hold. Discussed with patient I  do not think she needs at this time. She requested it since she was given it at last time she had bronchitis in 2012. Discussed other symptom care with tessalon and atrovent. Follow up as needed.

## 2017-02-09 NOTE — Patient Instructions (Addendum)
Upper Respiratory Infection, Adult Most upper respiratory infections (URIs) are caused by a virus. A URI affects the nose, throat, and upper air passages. The most common type of URI is often called "the common cold." Follow these instructions at home:  Take medicines only as told by your doctor.  Gargle warm saltwater or take cough drops to comfort your throat as told by your doctor.  Use a warm mist humidifier or inhale steam from a shower to increase air moisture. This may make it easier to breathe.  Drink enough fluid to keep your pee (urine) clear or pale yellow.  Eat soups and other clear broths.  Have a healthy diet.  Rest as needed.  Go back to work when your fever is gone or your doctor says it is okay.  You may need to stay home longer to avoid giving your URI to others.  You can also wear a face mask and wash your hands often to prevent spread of the virus.  Use your inhaler more if you have asthma.  Do not use any tobacco products, including cigarettes, chewing tobacco, or electronic cigarettes. If you need help quitting, ask your doctor. Contact a doctor if:  You are getting worse, not better.  Your symptoms are not helped by medicine.  You have chills.  You are getting more short of breath.  You have brown or red mucus.  You have yellow or brown discharge from your nose.  You have pain in your face, especially when you bend forward.  You have a fever.  You have puffy (swollen) neck glands.  You have pain while swallowing.  You have white areas in the back of your throat. Get help right away if:  You have very bad or constant:  Headache.  Ear pain.  Pain in your forehead, behind your eyes, and over your cheekbones (sinus pain).  Chest pain.  You have long-lasting (chronic) lung disease and any of the following:  Wheezing.  Long-lasting cough.  Coughing up blood.  A change in your usual mucus.  You have a stiff neck.  You have  changes in your:  Vision.  Hearing.  Thinking.  Mood. This information is not intended to replace advice given to you by your health care provider. Make sure you discuss any questions you have with your health care provider. Document Released: 05/29/2008 Document Revised: 08/13/2016 Document Reviewed: 03/18/2014 Elsevier Interactive Patient Education  2017 Elsevier Inc.  

## 2017-12-14 ENCOUNTER — Encounter: Payer: Self-pay | Admitting: Physician Assistant

## 2017-12-14 DIAGNOSIS — K769 Liver disease, unspecified: Secondary | ICD-10-CM | POA: Insufficient documentation

## 2018-04-10 ENCOUNTER — Ambulatory Visit: Payer: Medicare Other | Admitting: Physician Assistant

## 2018-04-23 ENCOUNTER — Other Ambulatory Visit: Payer: Self-pay | Admitting: Physician Assistant

## 2018-04-23 ENCOUNTER — Encounter: Payer: Self-pay | Admitting: Physician Assistant

## 2018-04-23 ENCOUNTER — Ambulatory Visit (INDEPENDENT_AMBULATORY_CARE_PROVIDER_SITE_OTHER): Payer: Medicare Other | Admitting: Physician Assistant

## 2018-04-23 ENCOUNTER — Telehealth: Payer: Self-pay | Admitting: Physician Assistant

## 2018-04-23 VITALS — BP 114/55 | HR 59 | Ht 62.5 in | Wt 152.0 lb

## 2018-04-23 DIAGNOSIS — R3915 Urgency of urination: Secondary | ICD-10-CM

## 2018-04-23 DIAGNOSIS — M810 Age-related osteoporosis without current pathological fracture: Secondary | ICD-10-CM | POA: Diagnosis not present

## 2018-04-23 DIAGNOSIS — N3281 Overactive bladder: Secondary | ICD-10-CM | POA: Diagnosis not present

## 2018-04-23 DIAGNOSIS — K769 Liver disease, unspecified: Secondary | ICD-10-CM

## 2018-04-23 LAB — POCT URINALYSIS DIPSTICK
BILIRUBIN UA: NEGATIVE
Blood, UA: NEGATIVE
GLUCOSE UA: NEGATIVE
KETONES UA: NEGATIVE
Leukocytes, UA: NEGATIVE
Nitrite, UA: NEGATIVE
PROTEIN UA: NEGATIVE
Spec Grav, UA: >=1.03 — AB (ref 1.010–1.025)
Urobilinogen, UA: 0.2 E.U./dL
pH, UA: 5.5 (ref 5.0–8.0)

## 2018-04-23 MED ORDER — MIRABEGRON ER 50 MG PO TB24: 50.0000 mg | ORAL_TABLET | Freq: Every day | ORAL | 1 refills | Status: DC

## 2018-04-23 MED ORDER — SOLIFENACIN SUCCINATE 5 MG PO TABS: 5.0000 mg | ORAL_TABLET | Freq: Every day | ORAL | 0 refills | Status: DC

## 2018-04-23 NOTE — Telephone Encounter (Signed)
[facility]. I will certainly fill out what we need to get approved.

## 2018-04-23 NOTE — Progress Notes (Signed)
Subjective:    Patient ID: Molly Pruitt, female    DOB: Feb 20, 1941, 77 y.o.   MRN: [id number]  HPI  Pt is a 77 yo female who presents to the clinic with urinary urgency. She feels like recently has been worsening. She has tried [facility]. She felt like vesicare more than myrbetriq. Pt goes between 2 doctors because she lives 2 places.   Pt is concerned about CT scan that showed liver lesion in December at ED visit. Results below:   5.6 x 4.0 x 4.0 cm low-density lesion is present at the posterior dome of the right hepatic lobe with density of 38. Small calcification is present at the superior aspect of this lesion. Additional smaller low-density lesion present at the dome of the  right hepatic lobe measuring 10 x 12 mm.  .. Active Ambulatory Problems    Diagnosis Date Noted  . HYPERLIPIDEMIA 01/06/2009  . DEPRESSION 01/06/2009  . 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 HEMORRHOIDS, EXTERNAL 01/06/2009  . UNSPECIFIED VAGINITIS AND VULVOVAGINITIS 01/06/2009  . PRURITUS ANI 01/06/2009  . BACK PAIN, LUMBAR, CHRONIC 11/21/2010  . OSTEOPOROSIS 01/06/2009  . INSOMNIA 01/06/2009  . Myocardial infarct, old 12/03/2011  . Rash and nonspecific skin eruption 01/21/2015  . Aortic regurgitation 09/27/2016  . Tricuspid regurgitation 09/27/2016  . Mitral regurgitation 09/27/2016  . Lesion of right lobe of liver 12/14/2017  . Liver disease 04/24/2018  . OAB (overactive bladder) 04/24/2018   Resolved Ambulatory Problems    Diagnosis Date Noted  . No Resolved Ambulatory Problems   No Additional Past Medical History      Review of Systems  All other systems reviewed and are negative.      Objective:   Physical Exam  Constitutional: She is oriented to person, place, and time. She appears well-developed and well-nourished.  HENT:  Head: Normocephalic and atraumatic.  Cardiovascular: Normal  rate and regular rhythm.  Pulmonary/Chest: Effort normal and breath sounds normal.  No CVA tenderness.   Abdominal: Soft. Bowel sounds are normal. She exhibits no distension and no mass. There is no tenderness. There is no guarding.  Neurological: She is alert and oriented to person, place, and time.  Skin: Skin is warm.  Psychiatric: She has a normal mood and affect. Her behavior is normal.          Assessment & Plan:  Marland KitchenMarland KitchenDiagnoses and all orders for this visit:  Urinary urgency -     POCT urinalysis dipstick -     Urine Culture -     COMPLETE METABOLIC PANEL WITH GFR -     CBC with Differential/Platelet -     Ferritin  Lesion of right lobe of liver -     COMPLETE METABOLIC PANEL WITH GFR -     CBC with Differential/Platelet -     Ferritin  Liver disease -     MR LIVER W WO CONTRAST  OAB (overactive bladder)  Other orders -     Discontinue: mirabegron ER (MYRBETRIQ) 50 MG TB24 tablet; Take 1 tablet (50 mg total) by mouth daily.   Results for orders placed or performed in visit on 04/23/18  Urine Culture  Result Value Ref Range   MICRO NUMBER: [id number]    SPECIMEN QUALITY: ADEQUATE    Sample Source NOT GIVEN    STATUS: FINAL    Result: No Growth   POCT urinalysis dipstick  Result Value Ref Range  Color, UA dark yellow    Clarity, UA clear    Glucose, UA negative    Bilirubin, UA negative    Ketones, UA negative    Spec Grav, UA >=1.030 (A) 1.010 - 1.025   Blood, UA negative    pH, UA 5.5 5.0 - 8.0   Protein, UA negative    Urobilinogen, UA 0.2 0.2 or 1.0 E.U./dL   Nitrite, UA negative    Leukocytes, UA Negative Negative   Appearance     Odor     Dipstick and culture were negative. Symptoms seem consistent with OAB. myrbetriq has less side effects and sent first but patient stated cost too much. Sent vesicare. Follow up in 3 months.   MRI with and without contrast to evaluate liver lesion.   Noticed not on osteoporosis treatment. Pt declined treatment  today after failing bisphosphonate.

## 2018-04-23 NOTE — Patient Instructions (Signed)

## 2018-04-23 NOTE — Telephone Encounter (Signed)
Pt called and states that her insurance will not cover Myrbetriq and they want her to try Vesicare 5mg  and this needs to be sent to [facility] for the patient. Patient was seen this morning by [facility]. Pt states that Luvenia Starch will need to do a Coverage review and Ph # is [phone]

## 2018-04-24 DIAGNOSIS — K769 Liver disease, unspecified: Secondary | ICD-10-CM | POA: Insufficient documentation

## 2018-04-24 DIAGNOSIS — N3281 Overactive bladder: Secondary | ICD-10-CM | POA: Insufficient documentation

## 2018-04-24 LAB — URINE CULTURE
MICRO NUMBER: 90525990
Result:: NO GROWTH
SPECIMEN QUALITY:: ADEQUATE

## 2018-04-24 NOTE — Telephone Encounter (Signed)
Order placed for liver lesion 1 one greater than 1cm and 1 less than 1 cm. No other symptoms. Found on CTA.

## 2018-04-24 NOTE — Telephone Encounter (Signed)
Patient advised of recommendations.  

## 2018-04-24 NOTE — Telephone Encounter (Signed)
Patient asked about the MRI and if it has been approved.

## 2018-04-24 NOTE — Telephone Encounter (Signed)
Patient advised.

## 2018-04-25 NOTE — Progress Notes (Signed)
Call pt: no growth of any bacterial in urine culture.

## 2018-04-26 ENCOUNTER — Telehealth: Payer: Self-pay

## 2018-04-26 NOTE — Progress Notes (Signed)
Please call imaging downstairs and see what they suggest to work up liver lesion found on CT without contrast.

## 2018-04-26 NOTE — Telephone Encounter (Signed)
Received fax from [employer] that Vesicare requires a PA. Information has been sent to the insurance company. Awaiting determination.

## 2018-04-26 NOTE — Telephone Encounter (Signed)
Can we call downstairs to imaging to what they suggest to work up liver lesion found on CT, please.

## 2018-04-26 NOTE — Telephone Encounter (Addendum)
I called the imaging department. They recommend with and without. Patient advised and will call us back after she thinks about it.

## 2018-04-26 NOTE — Telephone Encounter (Signed)
Verbal ok as long as she is ok.

## 2018-04-26 NOTE — Telephone Encounter (Signed)
Molly Pruitt would prefer not to have contrast with the MR. Please advise.

## 2018-04-26 NOTE — Telephone Encounter (Signed)
Vesicare requires a PA. Please contact [facility] for PA at [phone].

## 2018-04-29 LAB — COMPLETE METABOLIC PANEL WITH GFR
AG Ratio: 1.6 (calc) (ref 1.0–2.5)
ALBUMIN MSPROF: 3.7 g/dL (ref 3.6–5.1)
ALT: 11 U/L (ref 6–29)
AST: 17 U/L (ref 10–35)
Alkaline phosphatase (APISO): 57 U/L (ref 33–130)
BUN: 18 mg/dL (ref 7–25)
CALCIUM: 9.3 mg/dL (ref 8.6–10.4)
CO2: 28 mmol/L (ref 20–32)
Chloride: 103 mmol/L (ref 98–110)
Creat: 0.79 mg/dL (ref 0.60–0.93)
GFR, EST AFRICAN AMERICAN: 84 mL/min/{1.73_m2} (ref >=60)
GFR, EST NON AFRICAN AMERICAN: 72 mL/min/{1.73_m2} (ref >=60)
GLOBULIN: 2.3 g/dL (ref 1.9–3.7)
GLUCOSE: 86 mg/dL (ref 65–99)
Potassium: 5.4 mmol/L — ABNORMAL HIGH (ref 3.5–5.3)
SODIUM: 136 mmol/L (ref 135–146)
TOTAL PROTEIN: 6 g/dL — AB (ref 6.1–8.1)
Total Bilirubin: 0.9 mg/dL (ref 0.2–1.2)

## 2018-04-29 NOTE — Telephone Encounter (Signed)
Received a letter from [facility] that further information was needed for medication approval. Information sent to insurance.

## 2018-04-30 NOTE — Progress Notes (Signed)
Call pt: potassium up a tad. Recheck in 1 week labs only.   -[facility] needed prior auth for vesicare have you tried ditropan or detrol for OAB symptoms?

## 2018-05-01 NOTE — Telephone Encounter (Signed)
Received fax from Jeremy Johann that [place] was approved from 05/01/2018 through 12/24/2098. Pharmacy notified and forms sent to scan.   Reference ID: [id number].

## 2018-05-04 ENCOUNTER — Ambulatory Visit (HOSPITAL_BASED_OUTPATIENT_CLINIC_OR_DEPARTMENT_OTHER)
Admission: RE | Admit: 2018-05-04 | Discharge: 2018-05-04 | Disposition: A | Discharge status: Home / Self Care | Payer: Medicare Other | Source: Ambulatory Visit | Attending: Physician Assistant | Admitting: Physician Assistant

## 2018-05-04 DIAGNOSIS — K7689 Other specified diseases of liver: Secondary | ICD-10-CM | POA: Insufficient documentation

## 2018-05-04 DIAGNOSIS — K8020 Calculus of gallbladder without cholecystitis without obstruction: Secondary | ICD-10-CM | POA: Insufficient documentation

## 2018-05-04 DIAGNOSIS — N281 Cyst of kidney, acquired: Secondary | ICD-10-CM | POA: Diagnosis not present

## 2018-05-04 DIAGNOSIS — K769 Liver disease, unspecified: Secondary | ICD-10-CM | POA: Diagnosis present

## 2018-05-04 DIAGNOSIS — R160 Hepatomegaly, not elsewhere classified: Secondary | ICD-10-CM | POA: Insufficient documentation

## 2018-05-04 DIAGNOSIS — D1809 Hemangioma of other sites: Secondary | ICD-10-CM | POA: Insufficient documentation

## 2018-05-04 MED ORDER — GADOBENATE DIMEGLUMINE 529 MG/ML IV SOLN
15.0000 mL | Freq: Once | INTRAVENOUS | Status: AC | PRN
  Administered 2018-05-04: 13 mL via INTRAVENOUS

## 2018-05-06 ENCOUNTER — Encounter: Payer: Self-pay | Admitting: Physician Assistant

## 2018-05-06 DIAGNOSIS — K828 Other specified diseases of gallbladder: Secondary | ICD-10-CM | POA: Insufficient documentation

## 2018-05-06 DIAGNOSIS — K8020 Calculus of gallbladder without cholecystitis without obstruction: Secondary | ICD-10-CM | POA: Insufficient documentation

## 2018-05-06 DIAGNOSIS — D1803 Hemangioma of intra-abdominal structures: Secondary | ICD-10-CM | POA: Insufficient documentation

## 2018-05-06 NOTE — Progress Notes (Signed)
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 Your galllbadder does show gallstones and some thickening. If you continue to have any upper abdominal pain, nausea, diarrhea need to consider surgery to get gallbladder out.

## 2018-10-17 ENCOUNTER — Other Ambulatory Visit: Payer: Self-pay | Admitting: Physician Assistant

## 2018-10-25 ENCOUNTER — Ambulatory Visit: Payer: Medicare Other | Admitting: Physician Assistant

## 2019-09-09 ENCOUNTER — Encounter: Payer: Self-pay | Admitting: Physician Assistant

## 2019-09-18 DIAGNOSIS — I519 Heart disease, unspecified: Secondary | ICD-10-CM | POA: Insufficient documentation

## 2019-09-18 DIAGNOSIS — IMO0001 Reserved for inherently not codable concepts without codable children: Secondary | ICD-10-CM | POA: Insufficient documentation

## 2020-11-26 ENCOUNTER — Other Ambulatory Visit: Payer: Self-pay

## 2020-11-26 ENCOUNTER — Ambulatory Visit (INDEPENDENT_AMBULATORY_CARE_PROVIDER_SITE_OTHER): Payer: Medicare Other | Admitting: Physician Assistant

## 2020-11-26 ENCOUNTER — Encounter: Payer: Self-pay | Admitting: Physician Assistant

## 2020-11-26 VITALS — BP 116/55 | HR 72 | Ht 62.5 in | Wt 141.0 lb

## 2020-11-26 DIAGNOSIS — R239 Unspecified skin changes: Secondary | ICD-10-CM

## 2020-11-26 DIAGNOSIS — R21 Rash and other nonspecific skin eruption: Secondary | ICD-10-CM

## 2020-11-26 NOTE — Patient Instructions (Signed)
Use topical steroid for now.  Wear compression stockings for next week.  If kidney function is good we will prescribe anti-inflammatory.  Get labs.  Stool culture  CXR.

## 2020-11-26 NOTE — Progress Notes (Addendum)
Acute Office Visit  Subjective:    Patient ID: Molly Pruitt, female    DOB: [birth year], 79 y.o.   MRN: [id number]  Chief Complaint  Patient presents with  . Skin Problem    HPI Patient is in today for rash that began last night. Patient states this has happened before and only occurs when she is under stress. Patient says she is currently stressed about selling her house in [place] and moving to [place] full time.   The lesions are itchy, painful, and have a burning sensation. Patient describes them as starting small and growing larger over the course of a few hours. She also says that they occasionally have a few small vesicles. She was told by doctor in [place] that these are psoriasis and given topical steroid cream. It has not been helping.   Patient denies recent medication changes, recent illness. She does say that she had steroid injections in her back 4 days ago(in preparation for a possible future nerve ablation).    History reviewed. No pertinent past medical history.   Outpatient Medications Prior to Visit  Medication Sig Dispense Refill  . aspirin 81 MG tablet Take 1 tablet (81 mg total) by mouth daily. 30 tablet   . atorvastatin (LIPITOR) 10 MG tablet Take 10 mg by mouth daily.    . carvedilol (COREG) 6.25 MG tablet Take 1 tablet (6.25 mg total) by mouth 2 (two) times daily with a meal. 30 tablet 0  . co-enzyme Q-10 30 MG capsule Take 1 capsule (30 mg total) by mouth daily.    . Magnesium 250 MG TABS Take by mouth.    . solifenacin (VESICARE) 5 MG tablet Take 1 tablet (5 mg total) by mouth daily. Patient is due for a follow-up appointment with PCP for future refills. 30 tablet 0  . traMADol (ULTRAM) 50 MG tablet Take 1 tablet (50 mg total) by mouth every 8 (eight) hours as needed. 30 tablet 0  . triamcinolone (KENALOG) 0.1 % SMARTSIG:1 Application Topical 2-3 Times Daily    . vitamin D, CHOLECALCIFEROL, 400 UNITS tablet Take 1 tablet (400 Units total) by mouth daily. 30 tablet 0   . sertraline (ZOLOFT) 50 MG tablet Take 1 tablet (50 mg total) by mouth daily. 30 tablet 3   No facility-administered medications prior to visit.    Allergies  Allergen Reactions  . Bisphosphonates     Trouble swallowing.     Review of Systems  Constitutional: Negative for chills and fever.  Respiratory: Negative for cough.   Skin: Positive for rash.       Objective:    Physical Exam Skin:    Findings: Erythema, lesion (numerous lesions on lower legs as well as MCP joints of left hand) and rash present. Rash is macular (blanchable) and vesicular (one lesion had a cluster of three vesicles). Rash is not crusting or papular.          BP (!) 116/55   Pulse 72   Ht 5' 2.5" (1.588 m)   Wt 141 lb (64 kg)   SpO2 99%   BMI 25.38 kg/m  Wt Readings from Last 3 Encounters:  11/26/20 141 lb (64 kg)  04/23/18 152 lb (68.9 kg)  02/09/17 147 lb (66.7 kg)    Health Maintenance Due  Topic Date Due  . TETANUS/TDAP  Never done  . DEXA SCAN  Never done  . INFLUENZA VACCINE  07/25/2020      Assessment & Plan:   Rash and nonspecific  skin eruption  DDx: Differentials include erythema nodosum, psoriasis, steroid reaction. Suspicious of EN due to lesions being tender to palpation, diffuse on lower extremities, recurrent with emotional stress. Low suspicion of psoriasis/dermatitis due to lack of scaling and failure to improve with topical triamcinolone.   - Punch Biopsy performed today in office. Patient tolerated procedure well and was instructed to return on 12/06/2020. See procedure note below. - ESR, CRP, CBC, Chest X-ray, stool culture, CMP ordered to evaluate for causes of EN - Plan to consider NSAID for pain if kidney function looks ok on labwork -wear compression stockings.   Punch Biopsy Procedure Note  Pre-operative Diagnosis: Suspicious lesion  Post-operative Diagnosis: same  Locations:lateral, left thigh  Indications: lesion of unknown etiology, suspicious of  erythema nodosum  Anesthesia: Lidocaine 2% with epinephrine   Procedure Details  History of allergy to iodine: no Patient informed of the risks (including bleeding and infection) and benefits of the  procedure and Verbal informed consent obtained.  The lesion and surrounding area was given a sterile prep using chlorhexidine. The skin was then stretched perpendicular to the skin tension lines and the lesion removed using the 50m punch. The resulting ellipse was then closed. The wound was closed with 4-0 Nylon using simple interrupted stitches. Antibiotic ointment and a sterile dressing applied. The specimen was sent for pathologic examination. The patient tolerated the procedure well.  EBL: minimal   Condition: Stable  Complications: none.  Plan: 1. Instructed to keep the wound dry and covered for 24-48h and clean thereafter. 2. Warning signs of infection were reviewed.   3. Recommended that the patient use tylenol as needed for pain.  4. Return for suture removal in 10 days.   .Marland KitchenVernetta HoneyPA-C, have reviewed and agree with the above documentation in it's entirety.

## 2020-11-29 ENCOUNTER — Ambulatory Visit: Payer: Medicare Other | Admitting: Physician Assistant

## 2020-11-29 ENCOUNTER — Encounter: Payer: Self-pay | Admitting: Physician Assistant

## 2020-11-29 LAB — SEDIMENTATION RATE: Sed Rate: 2 mm/h (ref 0–30)

## 2020-11-29 NOTE — Addendum Note (Signed)
Addended by: Donella Stade on: 11/29/2020 08:21 AM   Modules accepted: Level of Service

## 2020-11-30 LAB — COMPLETE METABOLIC PANEL WITH GFR
AG Ratio: 2.4 (calc) (ref 1.0–2.5)
ALT: 11 U/L (ref 6–29)
AST: 14 U/L (ref 10–35)
Albumin: 4 g/dL (ref 3.6–5.1)
Alkaline phosphatase (APISO): 51 U/L (ref 37–153)
BUN: 18 mg/dL (ref 7–25)
CO2: 29 mmol/L (ref 20–32)
Calcium: 9.4 mg/dL (ref 8.6–10.4)
Chloride: 109 mmol/L (ref 98–110)
Creat: 0.76 mg/dL (ref 0.60–0.88)
GFR, Est African American: 86 mL/min/{1.73_m2} (ref >=60)
GFR, Est Non African American: 74 mL/min/{1.73_m2} (ref >=60)
Globulin: 1.7 g/dL (calc) — ABNORMAL LOW (ref 1.9–3.7)
Glucose, Bld: 78 mg/dL (ref 65–99)
Potassium: 4.7 mmol/L (ref 3.5–5.3)
Sodium: 142 mmol/L (ref 135–146)
Total Bilirubin: 1.1 mg/dL (ref 0.2–1.2)
Total Protein: 5.7 g/dL — ABNORMAL LOW (ref 6.1–8.1)

## 2020-11-30 LAB — CBC WITH DIFFERENTIAL/PLATELET
Absolute Monocytes: 498 cells/uL (ref 200–950)
Basophils Absolute: 37 cells/uL (ref 0–200)
Basophils Relative: 0.7 %
Eosinophils Absolute: 376 cells/uL (ref 15–500)
Eosinophils Relative: 7.1 %
HCT: 37.3 % (ref 35.0–45.0)
Hemoglobin: 12.2 g/dL (ref 11.7–15.5)
Lymphs Abs: 1219 cells/uL (ref 850–3900)
MCH: 30.3 pg (ref 27.0–33.0)
MCHC: 32.7 g/dL (ref 32.0–36.0)
MCV: 92.6 fL (ref 80.0–100.0)
MPV: 10.2 fL (ref 7.5–12.5)
Monocytes Relative: 9.4 %
Neutro Abs: 3169 cells/uL (ref 1500–7800)
Neutrophils Relative %: 59.8 %
Platelets: 253 10*3/uL (ref 140–400)
RBC: 4.03 10*6/uL (ref 3.80–5.10)
RDW: 12.2 % (ref 11.0–15.0)
Total Lymphocyte: 23 %
WBC: 5.3 10*3/uL (ref 3.8–10.8)

## 2020-11-30 LAB — C-REACTIVE PROTEIN: CRP: 0.7 mg/L (ref <8.0)

## 2020-11-30 NOTE — Progress Notes (Signed)
Inflammation rate is low.  Normal hemoglobin.  Low protein levels. Are you eating 3 meals a day? Are you getting protein in your diet?   More labs pending.

## 2020-12-03 NOTE — Progress Notes (Signed)
The biopsy shows spongiform dermatitis. This is in the family for eczema and more inflammatory. Topical steroids should help, like the one you have.if it is not then we need to switch to a stronger cream.

## 2020-12-06 ENCOUNTER — Ambulatory Visit (INDEPENDENT_AMBULATORY_CARE_PROVIDER_SITE_OTHER): Payer: Medicare Other | Admitting: Physician Assistant

## 2020-12-06 ENCOUNTER — Encounter: Payer: Self-pay | Admitting: Physician Assistant

## 2020-12-06 VITALS — BP 137/58 | HR 66 | Ht 62.5 in | Wt 143.0 lb

## 2020-12-06 DIAGNOSIS — L300 Nummular dermatitis: Secondary | ICD-10-CM | POA: Diagnosis not present

## 2020-12-06 DIAGNOSIS — L308 Other specified dermatitis: Secondary | ICD-10-CM | POA: Diagnosis not present

## 2020-12-06 MED ORDER — CLOBETASOL PROPIONATE 0.05 % EX CREA: 1.0000 "application " | TOPICAL_CREAM | Freq: Two times a day (BID) | CUTANEOUS | 0 refills | Status: DC

## 2020-12-06 NOTE — Progress Notes (Signed)
   Subjective:    Patient ID: Molly Pruitt, female    DOB: [birth year], 79 y.o.   MRN: [id number]  HPI  Pt is a 79 yo female who presents to the clinic 10 days after biopsy of her rash for suture removal and discussion of pathology report.   Rash does seem to be improving on triamcinolone cream.   .. Active Ambulatory Problems    Diagnosis Date Noted  . HYPERLIPIDEMIA 01/06/2009  . DEPRESSION 01/06/2009  . 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 HEMORRHOIDS, EXTERNAL 01/06/2009  . UNSPECIFIED VAGINITIS AND VULVOVAGINITIS 01/06/2009  . PRURITUS ANI 01/06/2009  . BACK PAIN, LUMBAR, CHRONIC 11/21/2010  . Osteoporosis 01/06/2009  . INSOMNIA 01/06/2009  . Myocardial infarct, old 12/03/2011  . Rash and nonspecific skin eruption 01/21/2015  . Aortic regurgitation 09/27/2016  . Mild tricuspid regurgitation 09/27/2016  . Mitral regurgitation 09/27/2016  . Lesion of right lobe of liver 12/14/2017  . Liver disease 04/24/2018  . OAB (overactive bladder) 04/24/2018  . Liver hemangioma 05/06/2018  . Cholelithiases 05/06/2018  . Thickening of wall of gallbladder 05/06/2018  . 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 Mild diastolic dysfunction 49/44/9675  . 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 No Resolved Ambulatory Problems   No Additional Past Medical History         Review of Systems  All other systems reviewed and are negative.      Objective:   Physical Exam Vitals reviewed.  Constitutional:      Appearance: Normal appearance.  HENT:     Head: Normocephalic.  Cardiovascular:     Rate and Rhythm: Normal rate and regular rhythm.  Neurological:     General: No focal deficit present.     Mental Status: She is alert and oriented to person, place, and time.  Psychiatric:        Mood and Affect:  Mood normal.     One simple interrupted suture removed of left upper thigh without complication and topical antibiotic cream with non stick bandage placed.       Assessment & Plan:  Marland KitchenMarland KitchenSeptember was seen today for follow-up.  Diagnoses and all orders for this visit:  Spongiotic dermatitis  Nummular eczema   I am going to give a stronger topical steroid to use as needed.  HO given to discuss prevention.  Keep moisturized.  If continues to occur or worsens then consider dermatology referral.

## 2020-12-06 NOTE — Patient Instructions (Addendum)
cetaphil  eucerin Aquafor   Use stronger topical steroid as needed.   Nummular Eczema Nummular eczema is a common disease that causes red, circular, crusted (plaque) lesions that may be itchy. It most commonly affects the lower legs and the backs of hands. Men tend to get their first outbreak between 50 and 80 years of age, and women tend to get their first outbreak during their teen or young adult years. What are the causes? The cause of this condition is not known. It may be related to certain skin sensitivities, such as sensitivity to:  Metals such as nickel and, rarely, mercury.  Formaldehyde.  Antibiotic medicine that is applied to the skin. What increases the risk? You are more likely to develop this condition if:  You have very dry skin.  You live in a place with dry and cold weather.  You have a personal or family history of eczema, asthma, or allergies.  You drink alcohol.  You have poor blood flow (circulation). What are the signs or symptoms? Symptoms most commonly affect the lower legs, but may also affect the hands, torso, arms, or feet. Symptoms include:  Groups of tiny, red spots.  Blister-like sores that leak fluid. These sores may grow together and form circular patches. After a long time, they may become crusty and then scaly.  Well-defined patches of pink, red, or brown skin.  Itchiness and burning, ranging from mild to severe. Itchiness may be worse at night and may cause trouble sleeping. Scratching lesions can cause bleeding. How is this diagnosed? This condition is diagnosed based on a physical exam and your medical history. Usually more tests are not needed, but you may need a swab test to check for skin infection. This test involves swabbing an affected area and testing the sample for bacteria (culture). You may work with a health care provider who specializes in the skin (dermatologist) to help diagnose and treat this condition. How is this  treated? There is no cure for this condition, but treatment can help relieve symptoms. Depending on how severe your symptoms are, your healthcare provider may suggest:  Medicine applied to the skin to reduce swelling and irritation (topical corticosteroids).  Medicine taken by mouth to reduce itching (oral antihistamines).  Antibiotic medicine to take by mouth (oral antibiotic) or to apply to your skin (topical antibiotic), if you have a skin infection.  Light therapy (phototherapy). This involves shining ultraviolet (UV) light on affected skin to reduce itchiness and inflammation.  Soaking in a bath that contains a type of salt that dries out blisters (potassium permanganate soaks). Follow these instructions at home: Medicines  Take over-the-counter and prescription medicines only as told by your health care provider.  If you were prescribed an antibiotic, take or apply it as told by your health care provider. Do not stop using the antibiotic even if you start to feel better. Skin Care   Keep your fingernails short to avoid breaking open the skin when you scratch.  Wash your hands with mild soap and water to avoid infection.  Pat your skin dry after bathing or washing your hands. Avoid rubbing your skin.  Keep your skin hydrated. To do this: ? Avoid very hot water. Take lukewarm baths or showers. ? Apply moisturizer within three minutes of bathing. This locks in moisture. ? Use a humidifier when you have the heating or air conditioning on. This will add moisture to the air.  Identify and avoid things that trigger symptoms or irritate your  skin. Triggers may include taking long, hot showers or baths, or not using creams or ointments to moisturize. Certain soaps may also trigger this condition. General instructions  Dress in clothes made of cotton or cotton blends. Avoid wearing clothes with wool fabric.  Avoid activities that may cause skin injury. Wear protective clothing when  doing outdoor activities such as gardening or hiking. Cuts, scrapes, and insect bites can make symptoms worse.  Keep all follow-up visits as told by your health care provider. This is important. Contact a health care provider if:  You develop a yellowish crust on an area of affected skin.  You have symptoms that do not go away with treatment or home care methods. Get help right away if:  You have more redness, pain, pus, or swelling. Summary  Nummular eczema is a common disease that causes red, circular, crusted (plaque) lesions that may be itchy.  The cause of this condition is not known. It may be related to certain skin sensitivities.  Treatments may include medicines to reduce swelling and irritation, avoiding triggers, and keeping your skin hydrated. This information is not intended to replace advice given to you by your health care provider. Make sure you discuss any questions you have with your health care provider. Document Revised: 02/06/2019 Document Reviewed: 04/26/2017 Elsevier Patient Education  [place].

## 2021-02-18 ENCOUNTER — Telehealth (INDEPENDENT_AMBULATORY_CARE_PROVIDER_SITE_OTHER): Payer: Medicare Other | Admitting: Physician Assistant

## 2021-02-18 VITALS — Ht 62.5 in | Wt 143.0 lb

## 2021-02-18 DIAGNOSIS — L308 Other specified dermatitis: Secondary | ICD-10-CM | POA: Diagnosis not present

## 2021-02-18 DIAGNOSIS — L300 Nummular dermatitis: Secondary | ICD-10-CM | POA: Diagnosis not present

## 2021-02-18 MED ORDER — CLOBETASOL PROPIONATE 0.05 % EX CREA: 1.0000 "application " | TOPICAL_CREAM | Freq: Two times a day (BID) | CUTANEOUS | 0 refills | Status: DC

## 2021-02-18 NOTE — Progress Notes (Signed)
Rash - went away Now on arms/hands - past creams aren't helping (Triamcinolone) Areas are itchy, not painful Wants to discuss referral to dermatology

## 2021-02-18 NOTE — Progress Notes (Signed)
Patient ID: Molly Pruitt, female   DOB: 1941-07-29, 80 y.o.   MRN: [id number] .Marland KitchenVirtual Visit via Telephone Note  I connected with Molly Pruitt on 02/18/2021 at  2:40 PM EST by telephone and verified that I am speaking with the correct person using two identifiers.  Location: Patient: home Provider: clinic  .Marland KitchenParticipating in visit:  Patient: Molly Pruitt Provider: Iran Planas PA-C   I discussed the limitations, risks, security and privacy concerns of performing an evaluation and management service by telephone and the availability of in person appointments. I also discussed with the patient that there may be a patient responsible charge related to this service. The patient expressed understanding and agreed to proceed.   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  She has not had an episode since early December.  She is under some particularly stressful situation with moving and organizing travel and cleaning.  Yesterday she started noticing red itching and a little painful spots coming up over bilateral wrist and knuckles.  She has 6 places today.  They range in sizes but averages 2 inches x 2 inches.  Patient is using triamcinolone with no benefit. It appears like it always has. No fever, chills, SOB, headache, vision changes.     .. Active Ambulatory Problems    Diagnosis Date Noted  . HYPERLIPIDEMIA 01/06/2009  . DEPRESSION 01/06/2009  . 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 HEMORRHOIDS, EXTERNAL 01/06/2009  . UNSPECIFIED VAGINITIS AND VULVOVAGINITIS 01/06/2009  . PRURITUS ANI 01/06/2009  . BACK PAIN, LUMBAR, CHRONIC 11/21/2010  . Osteoporosis 01/06/2009  . INSOMNIA 01/06/2009  . Myocardial infarct, old 12/03/2011  . Rash and nonspecific skin  eruption 01/21/2015  . Aortic regurgitation 09/27/2016  . Mild tricuspid regurgitation 09/27/2016  . Mitral regurgitation 09/27/2016  . Lesion of right lobe of liver 12/14/2017  . Liver disease 04/24/2018  . OAB (overactive bladder) 04/24/2018  . Liver hemangioma 05/06/2018  . Cholelithiases 05/06/2018  . Thickening of wall of gallbladder 05/06/2018  . 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 Mild diastolic dysfunction 22/97/9892  . 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 No Resolved Ambulatory Problems   No Additional Past Medical History   Reviewed med, allergy, problem list.  Observations/Objective: No acute distress Normal mood No labored breathing.  Not able to visualize rash at this time.   .. Today's Vitals   02/18/21 1443  Weight: 143 lb (64.9 kg)  Height: 5' 2.5" (1.588 m)   Body mass index is 25.74 kg/m.    Assessment and Plan: Marland KitchenMarland KitchenChalisa was seen today for rash.  Diagnoses and all orders for this visit:  Spongiotic dermatitis -     Ambulatory referral to Dermatology -     clobetasol cream (TEMOVATE) 0.05 %; Apply 1 application topically 2 (two) times daily.  biopsy showed spongotic dermatitis. Has the appearance of nummular eczema.  Pt declined oral prednisone.  Stop triamcinolone and start clobetasol.  Start claritin daily.  Use good moisturizer.  Referral made to dermatology. We have biopsy results but patient is almost having flares as result of stress would like consult.  Follow up as needed or with any new or worsening symptoms.    Follow Up Instructions:  I discussed the assessment and treatment plan with the patient. The patient was provided an opportunity to ask questions and all were answered. The patient agreed with the plan and demonstrated an understanding of the instructions.   The patient was advised to call back or seek an in-person evaluation if the  symptoms worsen or if the condition fails to improve as anticipated.  I provided 20 minutes of non-face-to-face time during this encounter.   Iran Planas, PA-C

## 2021-02-21 ENCOUNTER — Encounter: Payer: Self-pay | Admitting: Physician Assistant

## 2021-03-29 ENCOUNTER — Telehealth: Payer: Self-pay | Admitting: Neurology

## 2021-03-29 NOTE — Telephone Encounter (Signed)
Patient has appt scheduled with Dermatology, wanted to see Dr. Jimmye Norman but he is not taking new patients. She is scheduled with another provider in their office. I told her to keep appt with other provider and there was nothing we could do to get her an appt with a doctor who isn't taking new patients. She asked that I send a message to our referral coordinator anyway to make sure. Cindy - please review.

## 2021-03-29 NOTE — Telephone Encounter (Signed)
I also spoke with patient as well and let her know that there is nothing we can do about a provider who is no longer accepting new patients. She is going to keep her appointment on 5/5 with Dr. Michele Mcalpine. - CF

## 2021-07-19 ENCOUNTER — Other Ambulatory Visit: Payer: Self-pay

## 2021-07-19 ENCOUNTER — Ambulatory Visit (INDEPENDENT_AMBULATORY_CARE_PROVIDER_SITE_OTHER): Payer: Medicare Other | Admitting: Physician Assistant

## 2021-07-19 VITALS — BP 97/56 | HR 68 | Ht 62.5 in | Wt 136.0 lb

## 2021-07-19 DIAGNOSIS — K580 Irritable bowel syndrome with diarrhea: Secondary | ICD-10-CM

## 2021-07-19 DIAGNOSIS — I959 Hypotension, unspecified: Secondary | ICD-10-CM

## 2021-07-19 DIAGNOSIS — F419 Anxiety disorder, unspecified: Secondary | ICD-10-CM | POA: Diagnosis not present

## 2021-07-19 MED ORDER — SERTRALINE HCL 25 MG PO TABS: ORAL_TABLET | ORAL | 0 refills | Status: DC

## 2021-07-19 NOTE — Progress Notes (Signed)
April  Having diarrhea 4-5 times daily Saw GI - Dr. Landis Martins labs on stool - normal Told to take Imodium and Benefiber Still taking Imodium every day Now having one bowel movement in the AMs For last 4-6 weeks having bitter taste in mouth

## 2021-07-19 NOTE — Progress Notes (Signed)
Subjective:    Patient ID: Molly Pruitt, female    DOB: December 18, 1941, 80 y.o.   MRN: [id number]  HPI Patient is an 80 year old female with a history of anxiety and being on Zoloft who presents to the clinic with diarrhea since April 2022.  She admits she is undergoing a lot of stress over the past 6 months.  In April she started having loose stools about 4-5 times a day.  She did see Dr. Glennon Hamilton her gastroenterologist.  A work-up was done and he thought it was more her anxiety.  He suggested Imodium and Benefiber.  She did start the Imodium but has not been able to find the Benefiber.  Stools have improved to loose and about once or twice a day.  She denies any melena or hematochezia.  She denies any significant abdominal pain although she does have some abdominal cramping from time to time.  She denies any problems with acid reflux or epigastric pain.  She also reports a bitter taste in her mouth for months.  No known COVID.  She is vaccinated.  She does feel little on edge and anxious most of the time.  She was on Zoloft in the past.  She is unsure why she stopped.  Denies any depressed mood or suicidal thoughts or idealization.  Patient is not checking her blood pressure at home but did notice it was low today.  She denies any dizziness, headaches, vision changes, balance issues.   .. 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   HEMORRHOIDS, EXTERNAL 01/06/2009   UNSPECIFIED VAGINITIS AND VULVOVAGINITIS 01/06/2009   PRURITUS ANI 01/06/2009   BACK PAIN, LUMBAR, CHRONIC 11/21/2010   Osteoporosis 01/06/2009   INSOMNIA 01/06/2009   Myocardial infarct, old 12/03/2011   Rash and nonspecific skin eruption 01/21/2015   Aortic regurgitation 09/27/2016   Mild tricuspid regurgitation 09/27/2016   Mitral regurgitation  09/27/2016   Lesion of right lobe of liver 12/14/2017   Liver disease 04/24/2018   OAB (overactive bladder) 04/24/2018   Liver hemangioma 05/06/2018   Cholelithiases 05/06/2018   Thickening of wall of gallbladder 05/06/2018   Mild aortic sclerosis [id number]   Mild diastolic dysfunction [id number]   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     Objective:   Physical Exam Vitals reviewed.  Constitutional:      Appearance: Normal appearance.  HENT:     Head: Normocephalic.  Cardiovascular:     Rate and Rhythm: Normal rate and regular rhythm.     Heart sounds: Murmur heard.  Pulmonary:     Effort: Pulmonary effort is normal.     Breath sounds: Normal breath sounds.  Abdominal:     General: Bowel sounds are normal. There is no distension.     Palpations: Abdomen is soft. There is no mass.     Tenderness: There is no abdominal tenderness. There is no right CVA tenderness, left CVA tenderness, guarding or rebound.  Musculoskeletal:     Right lower leg: No edema.     Left lower leg: No edema.  Neurological:     General:  No focal deficit present.     Mental Status: She is alert and oriented to person, place, and time.  Psychiatric:        Mood and Affect: Mood normal.          Assessment & Plan:  Marland KitchenMarland KitchenDiagnoses and all orders for this visit:  Anxiety -     sertraline (ZOLOFT) 25 MG tablet; Start 1 tablet at bedtime may increase to 2 tablets after 1 week.  Hypotension, unspecified hypotension type  Irritable bowel syndrome with diarrhea -     sertraline (ZOLOFT) 25 MG tablet; Start 1 tablet at bedtime may increase to 2 tablets after 1 week.   Reviewed Dr. Aaron Edelman, gastroenterologist, notes in stool testing was negative for any bacteria or  abnormality. He thought diarrhea could be due to her anxiety. Discussed immodium and benefiber. She has taken immodium and making stools better but not been able to find benefiber. Showed her how to order online. She has been on zoloft in the past for anxiety. It could also help her GI problems. Restarted today. Consider bentyl in the future if diarrhea not improving with zoloft and benefiber. Discussed will take some time to normalize. She was on '50mg'$  in the past. IBS diet printed for patient. Continue to use immodium if diarrhea worsens. Follow up in 1 month.   BP low today. Pt is asymptomatic. Discussed increasing hydration and even a little salt in diet. Discussed red flags and concerns with low BP. Recheck in 1 month.   No known covid but patient also reports a "bitter taste" after things she used to love for months and now GI issues. Could be some post covid linguring symptoms. Pt is vaccinated.   Spent 30 minutes with patient reviewing chart and discussing anxiety/diarrhea/medications and treatment plan.

## 2021-07-19 NOTE — Patient Instructions (Signed)
Benefiber to start Restart zoloft Diet for Irritable Bowel Syndrome When you have irritable bowel syndrome (IBS), it is very important to eat the foods and follow the eating habits that are best for your condition. IBS may cause various symptoms such as pain in the abdomen, constipation, or diarrhea. Choosing the right foods can help to ease the discomfort from these symptoms. Work with your health care provider and diet and nutrition specialist (dietitian) to find the eating plan that will help to control your symptoms. What are tips for following this plan?     Keep a food diary. This will help you identify foods that cause symptoms. Write down: What you eat and when you eat it. What symptoms you have. When symptoms occur in relation to your meals, such as "pain in abdomen 2 hours after dinner." Eat your meals slowly and in a relaxed setting. Aim to eat 5-6 small meals per day. Do not skip meals. Drink enough fluid to keep your urine pale yellow. Ask your health care provider if you should take an over-the-counter probiotic to help restore healthy bacteria in your gut (digestive tract). Probiotics are foods that contain good bacteria and yeasts. Your dietitian may have specific dietary recommendations for you based on your symptoms. He or she may recommend that you: Avoid foods that cause symptoms. Talk with your dietitian about other ways to get the same nutrients that are in those problem foods. Avoid foods with gluten. Gluten is a protein that is found in rye, wheat, and barley. Eat more foods that contain soluble fiber. Examples of foods with high soluble fiber include oats, seeds, and certain fruits and vegetables. Take a fiber supplement if directed by your dietitian. Reduce or avoid certain foods called FODMAPs. These are foods that contain carbohydrates that are hard to digest. Ask your doctor which foods contain these carbohydrates. What foods are not recommended? The following are  some foods and drinks that may make your symptoms worse: Fatty foods, such as french fries. Foods that contain gluten, such as pasta and cereal. Dairy products, such as milk, cheese, and ice cream. Chocolate. Alcohol. Products with caffeine, such as coffee. Carbonated drinks, such as soda. Foods that are high in FODMAPs. These include certain fruits and vegetables. Products with sweeteners such as honey, high fructose corn syrup, sorbitol, and mannitol. The items listed above may not be a complete list of foods and beverages youshould avoid. Contact a dietitian for more information. What foods are good sources of fiber? Your health care provider or dietitian may recommend that you eat more foods that contain fiber. Fiber can help to reduce constipation and other IBS symptoms. Add foods with fiber to your diet a little at a time so your body can get used to them. Too much fiber at one time might cause gas and swelling of your abdomen. The following are some foods that are good sources of fiber: Berries, such as raspberries, strawberries, and blueberries. Tomatoes. Carrots. Brown rice. Oats. Seeds, such as chia and pumpkin seeds. The items listed above may not be a complete list of recommended sources offiber. Contact your dietitian for more options. Where to find more information [employer] for Functional Gastrointestinal Disorders: www.iffgd.[employer] and Digestive and Kidney Diseases: [employer] Summary When you have irritable bowel syndrome (IBS), it is very important to eat the foods and follow the eating habits that are best for your condition. IBS may cause various symptoms such as pain in the abdomen, constipation,  or diarrhea. Choosing the right foods can help to ease the discomfort that comes from symptoms. Keep a food diary. This will help you identify foods that cause symptoms. Your health care provider or diet and nutrition  specialist (dietitian) may recommend that you eat more foods that contain fiber. This information is not intended to replace advice given to you by your health care provider. Make sure you discuss any questions you have with your healthcare provider. Document Revised: 08/12/2020 Document Reviewed: 08/12/2020 Elsevier Patient Education  2022 [employer].

## 2021-07-20 ENCOUNTER — Encounter: Payer: Self-pay | Admitting: Physician Assistant

## 2021-07-20 DIAGNOSIS — I959 Hypotension, unspecified: Secondary | ICD-10-CM | POA: Insufficient documentation

## 2021-07-20 DIAGNOSIS — F419 Anxiety disorder, unspecified: Secondary | ICD-10-CM | POA: Insufficient documentation

## 2021-08-16 ENCOUNTER — Other Ambulatory Visit: Payer: Self-pay | Admitting: Physician Assistant

## 2021-08-16 ENCOUNTER — Ambulatory Visit (INDEPENDENT_AMBULATORY_CARE_PROVIDER_SITE_OTHER): Payer: Medicare Other

## 2021-08-16 ENCOUNTER — Encounter: Payer: Self-pay | Admitting: Physician Assistant

## 2021-08-16 ENCOUNTER — Ambulatory Visit (INDEPENDENT_AMBULATORY_CARE_PROVIDER_SITE_OTHER): Payer: Medicare Other | Admitting: Physician Assistant

## 2021-08-16 ENCOUNTER — Other Ambulatory Visit: Payer: Self-pay

## 2021-08-16 VITALS — BP 116/57 | HR 65 | Ht 62.5 in | Wt 135.5 lb

## 2021-08-16 DIAGNOSIS — R0781 Pleurodynia: Secondary | ICD-10-CM

## 2021-08-16 DIAGNOSIS — K580 Irritable bowel syndrome with diarrhea: Secondary | ICD-10-CM

## 2021-08-16 DIAGNOSIS — M810 Age-related osteoporosis without current pathological fracture: Secondary | ICD-10-CM

## 2021-08-16 DIAGNOSIS — F419 Anxiety disorder, unspecified: Secondary | ICD-10-CM | POA: Diagnosis not present

## 2021-08-16 MED ORDER — SERTRALINE HCL 50 MG PO TABS: 50.0000 mg | ORAL_TABLET | Freq: Every day | ORAL | 5 refills | Status: AC

## 2021-08-16 NOTE — Progress Notes (Signed)
Subjective:    Patient ID: Molly Pruitt, female    DOB: 06-01-1941, 80 y.o.   MRN: [id number]  HPI Molly Pruitt is a 80 yo female with anxiety, IBS-D, osteoporosis who presents to the clinic for follow up.   Molly Pruitt has been taking zoloft '25mg'$  at bedtime and diarrhea is better. Molly Pruitt is having 2 episodes on average a week now. Molly Pruitt was having every day all day. Molly Pruitt stools are always more on the soft side. No abdominal pain. No melena or hematochezia. Molly Pruitt has upper GI scheduled for Thursday due to bitter taste in mouth. Molly Pruitt started pepcid. Not noticed much of a difference right now.   Saturday, 4 days ago, Molly Pruitt leaned up against shelves at the store to get something and heard a pop and pain in Molly Pruitt left anterior ribs. They have been hurting since. Worse with deep breath. Not taken anything for it.   Molly Pruitt has a reported hx of osteoporosis but no dexa on file. Will get bone density screening and then get prolia approved to try.   .. 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   HEMORRHOIDS, EXTERNAL 01/06/2009   UNSPECIFIED VAGINITIS AND VULVOVAGINITIS 01/06/2009   PRURITUS ANI 01/06/2009   BACK PAIN, LUMBAR, CHRONIC 11/21/2010   Osteoporosis 01/06/2009   INSOMNIA 01/06/2009   Myocardial infarct, old 12/03/2011   Rash and nonspecific skin eruption 01/21/2015   Aortic regurgitation 09/27/2016   Mild tricuspid regurgitation 09/27/2016   Mitral regurgitation 09/27/2016   Lesion of right lobe of liver 12/14/2017   Liver disease 04/24/2018   OAB (overactive bladder) 04/24/2018   Liver hemangioma 05/06/2018   Cholelithiases 05/06/2018   Thickening of wall of gallbladder 05/06/2018   Mild aortic sclerosis [id number]   Mild diastolic dysfunction [id number]   Spongiotic dermatitis 12/06/2020   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     Objective:   Physical Exam Vitals reviewed.  Constitutional:      Appearance: Normal appearance.  HENT:     Head: Normocephalic.  Cardiovascular:     Rate and Rhythm: Normal rate and regular rhythm.     Heart sounds: Murmur heard.  Pulmonary:     Effort: Pulmonary effort is normal.     Breath sounds: Normal breath sounds.  Abdominal:     General: Bowel sounds are normal. There is no distension.     Palpations: Abdomen is soft. There is no mass.     Tenderness: There is no abdominal tenderness. There is no right CVA tenderness, left CVA tenderness, guarding or rebound.  Musculoskeletal:     Right lower leg: No edema.     Left lower leg: No edema.     Comments: Pain to palpation over anterior left lower ribs 8,9, 10. Slightly swollen but no bruising.   Neurological:     General: No focal deficit present.     Mental Status: Molly Pruitt is alert and oriented to person, place, and time.  Psychiatric:        Mood and Affect: Mood normal.         .Marland Kitchen  Depression screen [facility] 2/9 08/16/2021  Decreased Interest 0  Down, Depressed, Hopeless 0  PHQ - 2 Score 0  Altered sleeping 1  Tired, decreased energy 1  Change in appetite 1  Feeling bad or failure about yourself  0  Trouble concentrating 0  Moving slowly or fidgety/restless 0  Suicidal thoughts 0  PHQ-9 Score 3  Difficult doing work/chores Not difficult at all   .Marland Kitchen GAD 7 : Generalized Anxiety Score 08/16/2021  Nervous, Anxious, on Edge 0  Control/stop worrying 0  Worry too much - different things 0  Trouble relaxing 0  Restless 0  Easily annoyed or irritable 0  Afraid - awful might happen 0  Total GAD 7 Score 0     Assessment  & Plan:  Marland KitchenMarland KitchenAkyah was seen today for phq-9 4 week follow-up.  Diagnoses and all orders for this visit:  Irritable bowel syndrome with diarrhea -     sertraline (ZOLOFT) 50 MG tablet; Take 1 tablet (50 mg total) by mouth at bedtime.  Age-related osteoporosis without current pathological fracture -     DG Bone Density; Future  Rib pain on left side -     Cancel: DG Ribs Unilateral W/Chest Left; Future -     Cancel: DG Ribs Unilateral Left; Future  Anxiety -     sertraline (ZOLOFT) 50 MG tablet; Take 1 tablet (50 mg total) by mouth at bedtime.   Diarrhea does seem to be improving with zoloft. Molly Pruitt has only been taking '25mg'$ . I increased to '50mg'$  at bedtime.  Keep food diary so that you can look for food triggers.  Diet HO on foods to avoid.  Start probiotic culturelle.  Continue work up with GI for bitter taste in mouth.  Consider stopping magnesium for loose stools.   Molly Pruitt did have rib injury/pain. Will get xray. Concerning for fracture since Molly Pruitt has osteoporosis and not taking medication.  Ice and take tramadol that you have.  Discussed need to get dexa and consider other medication options. Molly Pruitt did not tolerate bisphosphonate. Molly Pruitt is taking vitamin D and calcium.   Follow up in 3 months or sooner if not improving or worsening.

## 2021-08-16 NOTE — Progress Notes (Signed)
GREAT news. No fracture of the rib. Likely just some pulled cartilage that with cool compresses and time will heal.

## 2021-08-16 NOTE — Patient Instructions (Addendum)
Rib Fracture  A rib fracture is a break or crack in one of the bones of the ribs. The ribs are like a cage that goes around your upper chest. A broken or cracked rib is often painful, but most do not cause other problems. Most rib fractures usuallyheal on their own in 1-3 months. What are the causes? Doing movements over and over again with a lot of force, such as pitching a baseball or having a very bad cough. A direct hit to the chest. Cancer that has spread to the bones. What are the signs or symptoms? Pain when you breathe in or cough. Pain when someone presses on the injured area. Feeling short of breath. How is this treated? Treatment depends on how bad the fracture is. In general: Most rib fractures usually heal on their own in 1-3 months. Healing may take longer if you have a cough or are doing activities that make the injury worse. While you heal, you may be given medicines to control pain. You will also be taught deep breathing exercises. Very bad injuries may require a stay at the hospital or surgery. Follow these instructions at home: Managing pain, stiffness, and swelling If told, put ice on the injured area. To do this: Put ice in a plastic bag. Place a towel between your skin and the bag. Leave the ice on for 20 minutes, 2-3 times a day. Take off the ice if your skin turns bright red. This is very important. If you cannot feel pain, heat, or cold, you have a greater risk of damage to the area. Take over-the-counter and prescription medicines only as told by your doctor. Activity Avoid activities that cause pain to the injured area. Protect your injured area. Slowly increase activity as told by your doctor. General instructions Do deep breathing exercises as told by your doctor. You may be told to: Take deep breaths many times a day. Cough several times a day while hugging a pillow. Use a device (incentive spirometer) to do deep breathing many times a day. Drink enough  fluid to keep your pee (urine) clear or pale yellow. Do not wear a rib belt or binder. Keep all follow-up visits. Contact a doctor if: You have a fever. Get help right away if: You have trouble breathing. You are short of breath. You cannot stop coughing. You cough up thick or bloody spit. You feel like you may vomit (nauseous), vomit, or have belly (abdominal) pain. Your pain gets worse and medicine does not help. These symptoms may be an emergency. Get help right away. Call your local emergency services (911 in the [place] Do not wait to see if the symptoms will go away. Do not drive yourself to the hospital. Summary A rib fracture is a break or crack in one of the bones of the ribs. Apply ice to the injured area and take medicines for pain as told by your doctor. Take deep breaths and cough several times a day. Hug a pillow every time you cough. This information is not intended to replace advice given to you by your health care provider. Make sure you discuss any questions you have with your healthcare provider. Document Revised: 04/02/2020 Document Reviewed: 04/02/2020 Elsevier Patient Education  2022 [employer]. Diet for Irritable Bowel Syndrome When you have irritable bowel syndrome (IBS), it is very important to eat the foods and follow the eating habits that are best for your condition. IBS may cause various symptoms such as pain in the abdomen,  constipation, or diarrhea. Choosing the right foods can help to ease the discomfort from these symptoms. Work with your health care provider and diet and nutrition specialist (dietitian) to find the eating plan that will help to control your symptoms. What are tips for following this plan?     Keep a food diary. This will help you identify foods that cause symptoms. Write down: What you eat and when you eat it. What symptoms you have. When symptoms occur in relation to your meals, such as "pain in abdomen 2 hours after dinner." Eat  your meals slowly and in a relaxed setting. Aim to eat 5-6 small meals per day. Do not skip meals. Drink enough fluid to keep your urine pale yellow. Ask your health care provider if you should take an over-the-counter probiotic to help restore healthy bacteria in your gut (digestive tract). Probiotics are foods that contain good bacteria and yeasts. Your dietitian may have specific dietary recommendations for you based on your symptoms. He or she may recommend that you: Avoid foods that cause symptoms. Talk with your dietitian about other ways to get the same nutrients that are in those problem foods. Avoid foods with gluten. Gluten is a protein that is found in rye, wheat, and barley. Eat more foods that contain soluble fiber. Examples of foods with high soluble fiber include oats, seeds, and certain fruits and vegetables. Take a fiber supplement if directed by your dietitian. Reduce or avoid certain foods called FODMAPs. These are foods that contain carbohydrates that are hard to digest. Ask your doctor which foods contain these carbohydrates. What foods are not recommended? The following are some foods and drinks that may make your symptoms worse: Fatty foods, such as french fries. Foods that contain gluten, such as pasta and cereal. Dairy products, such as milk, cheese, and ice cream. Chocolate. Alcohol. Products with caffeine, such as coffee. Carbonated drinks, such as soda. Foods that are high in FODMAPs. These include certain fruits and vegetables. Products with sweeteners such as honey, high fructose corn syrup, sorbitol, and mannitol. The items listed above may not be a complete list of foods and beverages youshould avoid. Contact a dietitian for more information. What foods are good sources of fiber? Your health care provider or dietitian may recommend that you eat more foods that contain fiber. Fiber can help to reduce constipation and other IBS symptoms. Add foods with fiber to  your diet a little at a time so your body can get used to them. Too much fiber at one time might cause gas and swelling of your abdomen. The following are some foods that are good sources of fiber: Berries, such as raspberries, strawberries, and blueberries. Tomatoes. Carrots. Brown rice. Oats. Seeds, such as chia and pumpkin seeds. The items listed above may not be a complete list of recommended sources offiber. Contact your dietitian for more options. Where to find more information International Foundation for Functional Gastrointestinal Disorders: www.iffgd.[employer] of Diabetes and Digestive and Kidney Diseases: [employer] Summary When you have irritable bowel syndrome (IBS), it is very important to eat the foods and follow the eating habits that are best for your condition. IBS may cause various symptoms such as pain in the abdomen, constipation, or diarrhea. Choosing the right foods can help to ease the discomfort that comes from symptoms. Keep a food diary. This will help you identify foods that cause symptoms. Your health care provider or diet and nutrition specialist (dietitian) may recommend that you eat more foods  that contain fiber. This information is not intended to replace advice given to you by your health care provider. Make sure you discuss any questions you have with your healthcare provider. Document Revised: 08/12/2020 Document Reviewed: 08/12/2020 Elsevier Patient Education  [place] stands for fermentable oligosaccharides, disaccharides, monosaccharides, and polyols. These are sugars that are hard for some people to digest. A low-FODMAP eating plan may help some people who have irritable bowel syndrome (IBS) and certain other bowel (intestinal) diseases to manage their symptoms. This meal plan can be complicated to follow. Work with a diet and nutrition specialist (dietitian) to make a low-FODMAP eating plan  that is right for you. A dietitian can helpmake sure that you get enough nutrition from this diet. What are tips for following this plan? Reading food labels Check labels for hidden FODMAPs such as: High-fructose syrup. Honey. Agave. Natural fruit flavors. Onion or garlic powder. Choose low-FODMAP foods that contain 3-4 grams of fiber per serving. Check food labels for serving sizes. Eat only one serving at a time to make sure FODMAP levels stay low. Shopping Shop with a list of foods that are recommended on this diet and make a meal plan. Meal planning Follow a low-FODMAP eating plan for up to 6 weeks, or as told by your health care provider or dietitian. To follow the eating plan: Eliminate high-FODMAP foods from your diet completely. Choose only low-FODMAP foods to eat. You will do this for 2-6 weeks. Gradually reintroduce high-FODMAP foods into your diet one at a time. Most people should wait a few days before introducing the next new high-FODMAP food into their meal plan. Your dietitian can recommend how quickly you may reintroduce foods. Keep a daily record of what and how much you eat and drink. Make note of any symptoms that you have after eating. Review your daily record with a dietitian regularly to identify which foods you can eat and which foods you should avoid. General tips Drink enough fluid each day to keep your urine pale yellow. Avoid processed foods. These often have added sugar and may be high in FODMAPs. Avoid most dairy products, whole grains, and sweeteners. Work with a dietitian to make sure you get enough fiber in your diet. Avoid high FODMAP foods at meals to manage symptoms. Recommended foods Fruits Bananas, oranges, tangerines, lemons, limes, blueberries, raspberries, strawberries, grapes, cantaloupe, honeydew melon, kiwi, papaya, passion fruit, and pineapple. Limited amounts of dried cranberries, banana chips, and shreddedcoconut. Vegetables Eggplant,  zucchini, cucumber, peppers, green beans, bean sprouts, lettuce, arugula, kale, Swiss chard, spinach, collard greens, bok choy, summer squash, potato, and tomato. Limited amounts of corn, carrot, and sweet potato. Greenparts of scallions. Grains Gluten-free grains, such as rice, oats, buckwheat, quinoa, corn, polenta, andmillet. Gluten-free pasta, bread, or cereal. Rice noodles. Corn tortillas. Meats and other proteins Unseasoned beef, pork, poultry, or fish. Eggs. Berniece Salines. Tofu (firm) and tempeh. Limited amounts of nuts and seeds, such as almonds, walnuts, [place] nuts,pecans, peanuts, nut butters, pumpkin seeds, chia seeds, and sunflower seeds. Dairy Lactose-free milk, yogurt, and kefir. Lactose-free cottage cheese and ice cream. Non-dairy milks, such as almond, coconut, hemp, and rice milk. Non-dairy yogurt. Limited amounts of goat cheese, brie, mozzarella, parmesan, swiss, andother hard cheeses. Fats and oils Butter-free spreads. Vegetable oils, such as olive, canola, and sunflower oil. Seasoning and other foods Artificial sweeteners with names that do not end in "ol," such as aspartame, saccharine, and stevia. Maple syrup, white table sugar, raw  sugar, brown sugar, and molasses. Mayonnaise, soy sauce, and tamari. Fresh basil, coriander,parsley, rosemary, and thyme. Beverages Water and mineral water. Sugar-sweetened soft drinks. Small amounts of orangejuice or cranberry juice. Black and green tea. Most dry wines. Coffee. The items listed above may not be a complete list of foods and beverages you can eat. Contact a dietitian for more information. Foods to avoid Fruits Fresh, dried, and juiced forms of apple, pear, watermelon, peach, plum, cherries, apricots, blackberries, boysenberries, figs, nectarines, and mango.Avocado. Vegetables Chicory root, artichoke, asparagus, cabbage, snow peas, Brussels sprouts, broccoli, sugar snap peas, mushrooms, celery, and cauliflower. Onions, garlic,leeks, and the  white part of scallions. Grains Wheat, including kamut, durum, and semolina. Barley and bulgur. Couscous.Wheat-based cereals. Wheat noodles, bread, crackers, and pastries. Meats and other proteins Fried or fatty meat. Sausage. Cashews and pistachios. Soybeans, baked beans, black beans, chickpeas, kidney beans, fava beans, navy beans, lentils,black-eyed peas, and split peas. Dairy Milk, yogurt, ice cream, and soft cheese. Cream and sour cream. Milk-basedsauces. Custard. Buttermilk. Soy milk. Seasoning and other foods Any sugar-free gum or candy. Foods that contain artificial sweeteners such as sorbitol, mannitol, isomalt, or xylitol. Foods that contain honey, high-fructose corn syrup, or agave. Bouillon, vegetable stock, beef stock, and chicken stock. Garlic and onion powder. Condiments made with onion, such ashummus, chutney, pickles, relish, salad dressing, and salsa. Tomato paste. Beverages Chicory-based drinks. Coffee substitutes. Chamomile tea. Fennel tea. Sweet or fortified wines such as port or sherry. Diet soft drinks made with isomalt, mannitol, maltitol, sorbitol, or xylitol. Apple, pear, and mango juice. Juiceswith high-fructose corn syrup. The items listed above may not be a complete list of foods and beverages you should avoid. Contact a dietitian for more information. Summary FODMAP stands for fermentable oligosaccharides, disaccharides, monosaccharides, and polyols. These are sugars that are hard for some people to digest.  A low-FODMAP eating plan is a short-term diet that helps to ease symptoms of certain bowel diseases. The eating plan usually lasts up to 6 weeks. After that, high-FODMAP foods are reintroduced gradually and one at a time. This can help you find out which foods may be causing symptoms. A low-FODMAP eating plan can be complicated. It is best to work with a dietitian who has experience with this type of plan. This information is not intended to replace advice given to  you by your health care provider. Make sure you discuss any questions you have with your healthcare provider. Document Revised: 04/29/2020 Document Reviewed: 04/29/2020 Elsevier Patient Education  [place].

## 2021-08-17 DIAGNOSIS — K580 Irritable bowel syndrome with diarrhea: Secondary | ICD-10-CM | POA: Insufficient documentation

## 2021-08-17 DIAGNOSIS — R0781 Pleurodynia: Secondary | ICD-10-CM | POA: Insufficient documentation

## 2022-08-11 ENCOUNTER — Other Ambulatory Visit: Payer: Self-pay | Admitting: Physician Assistant

## 2022-08-11 DIAGNOSIS — M810 Age-related osteoporosis without current pathological fracture: Secondary | ICD-10-CM
